# Patient Record
Sex: Male | Born: 1965
Health system: Southern US, Community
[De-identification: ages and names within clinical notes are randomized; demographics above are authoritative.]

## PROBLEM LIST (undated history)

## (undated) DIAGNOSIS — K219 Gastro-esophageal reflux disease without esophagitis: Secondary | ICD-10-CM

## (undated) DIAGNOSIS — E785 Hyperlipidemia, unspecified: Secondary | ICD-10-CM

---

## 2004-12-07 ENCOUNTER — Emergency Department: Payer: Self-pay | Admitting: Emergency Medicine

## 2008-10-19 ENCOUNTER — Emergency Department: Payer: Self-pay | Admitting: Emergency Medicine

## 2010-10-07 ENCOUNTER — Ambulatory Visit: Payer: Self-pay | Admitting: Otolaryngology

## 2010-10-28 ENCOUNTER — Ambulatory Visit: Payer: Self-pay | Admitting: Otolaryngology

## 2011-04-07 ENCOUNTER — Emergency Department: Payer: Self-pay | Admitting: Unknown Physician Specialty

## 2013-08-22 ENCOUNTER — Emergency Department: Payer: Self-pay | Admitting: Emergency Medicine

## 2013-08-22 LAB — URINALYSIS, COMPLETE
Bacteria: NEGATIVE
Bilirubin,UR: NEGATIVE
Glucose,UR: >=500 mg/dL (ref 0–75)
KETONE: NEGATIVE
Leukocyte Esterase: NEGATIVE
Nitrite: NEGATIVE
PROTEIN: NEGATIVE
Ph: 5 (ref 4.5–8.0)
Specific Gravity: 1.021 (ref 1.003–1.030)

## 2013-08-22 LAB — HEPATIC FUNCTION PANEL A (ARMC)
ALT: 52 U/L (ref 12–78)
AST: 29 U/L (ref 15–37)
Albumin: 3.9 g/dL (ref 3.4–5.0)
Alkaline Phosphatase: 69 U/L
Bilirubin, Direct: <0.1 mg/dL (ref 0.00–0.20)
Bilirubin,Total: 0.5 mg/dL (ref 0.2–1.0)
TOTAL PROTEIN: 8 g/dL (ref 6.4–8.2)

## 2013-08-22 LAB — LIPASE, BLOOD: LIPASE: 112 U/L (ref 73–393)

## 2013-08-22 LAB — CBC WITH DIFFERENTIAL/PLATELET
BASOS ABS: 0 10*3/uL (ref 0.0–0.1)
Basophil %: 0.2 %
EOS ABS: 0 10*3/uL (ref 0.0–0.7)
Eosinophil %: 0.1 %
HCT: 43 % (ref 40.0–52.0)
HGB: 15 g/dL (ref 13.0–18.0)
LYMPHS PCT: 7.5 %
Lymphocyte #: 0.8 10*3/uL — ABNORMAL LOW (ref 1.0–3.6)
MCH: 31.5 pg (ref 26.0–34.0)
MCHC: 34.9 g/dL (ref 32.0–36.0)
MCV: 91 fL (ref 80–100)
MONO ABS: 0.3 x10 3/mm (ref 0.2–1.0)
MONOS PCT: 2.6 %
Neutrophil #: 10 10*3/uL — ABNORMAL HIGH (ref 1.4–6.5)
Neutrophil %: 89.6 %
Platelet: 394 10*3/uL (ref 150–440)
RBC: 4.76 10*6/uL (ref 4.40–5.90)
RDW: 13 % (ref 11.5–14.5)
WBC: 11.1 10*3/uL — ABNORMAL HIGH (ref 3.8–10.6)

## 2013-08-22 LAB — BASIC METABOLIC PANEL
Anion Gap: 10 (ref 7–16)
BUN: 18 mg/dL (ref 7–18)
CREATININE: 0.95 mg/dL (ref 0.60–1.30)
Calcium, Total: 8.8 mg/dL (ref 8.5–10.1)
Chloride: 103 mmol/L (ref 98–107)
Co2: 24 mmol/L (ref 21–32)
EGFR (African American): >60
EGFR (Non-African Amer.): >60
Glucose: 124 mg/dL — ABNORMAL HIGH (ref 65–99)
OSMOLALITY: 277 (ref 275–301)
Potassium: 3.6 mmol/L (ref 3.5–5.1)
Sodium: 137 mmol/L (ref 136–145)

## 2013-08-22 LAB — TROPONIN I

## 2014-01-12 ENCOUNTER — Emergency Department: Payer: Self-pay | Admitting: Emergency Medicine

## 2015-09-24 IMAGING — CR RIGHT INDEX FINGER 2+V
1 series · 3 of 3 positions shown · non-contrast
Comparison: None.

CLINICAL DATA: Table saw injury, initial encounter

EXAM:
RIGHT INDEX FINGER 2+V

[Series 1: pa · 0.17mm/px · 3 of 3 slices shown]
[im 1/3]
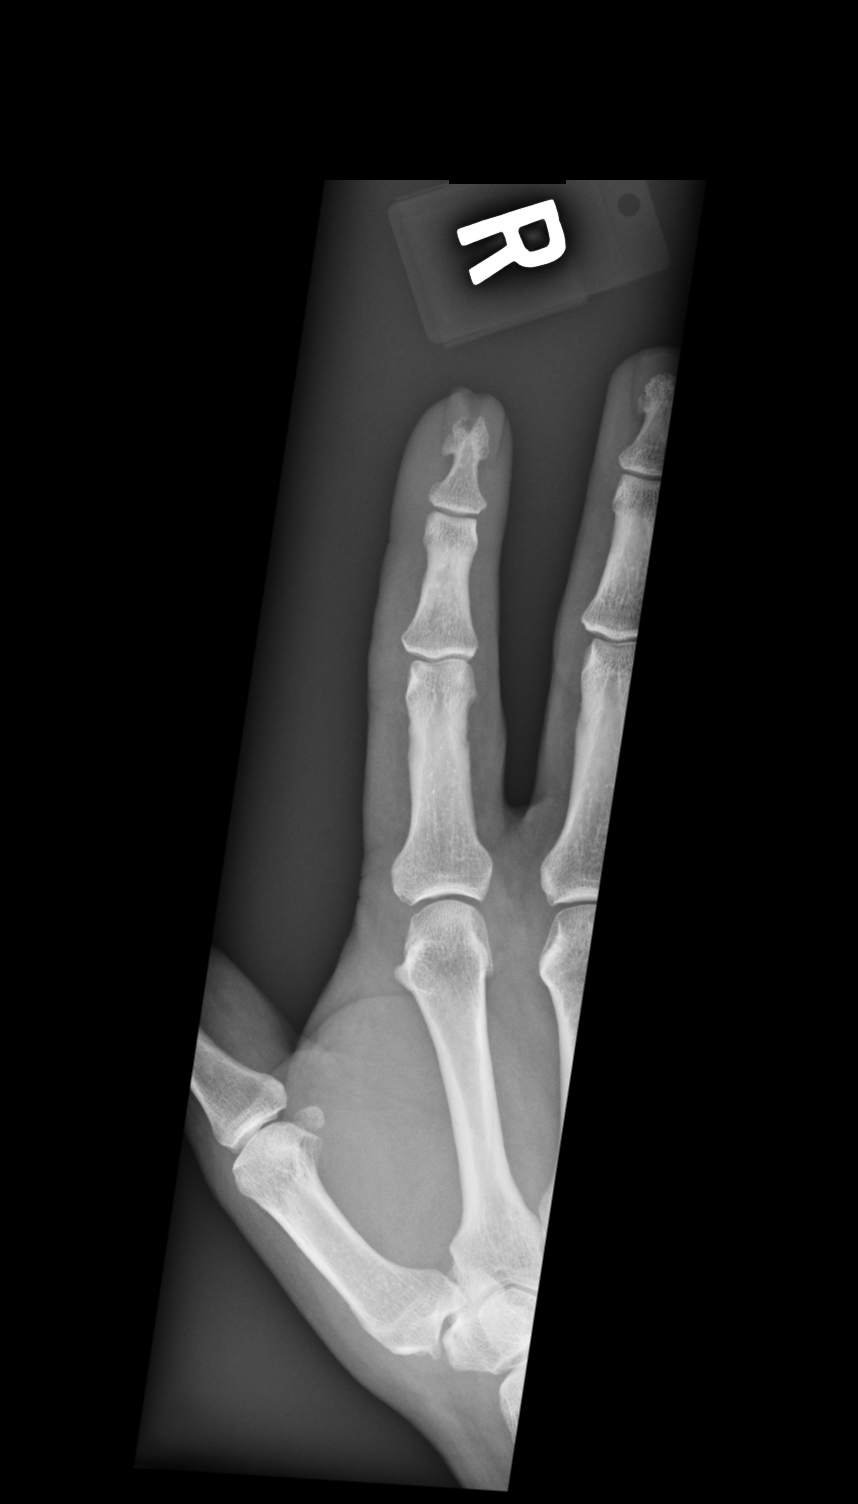
[im 2/3]
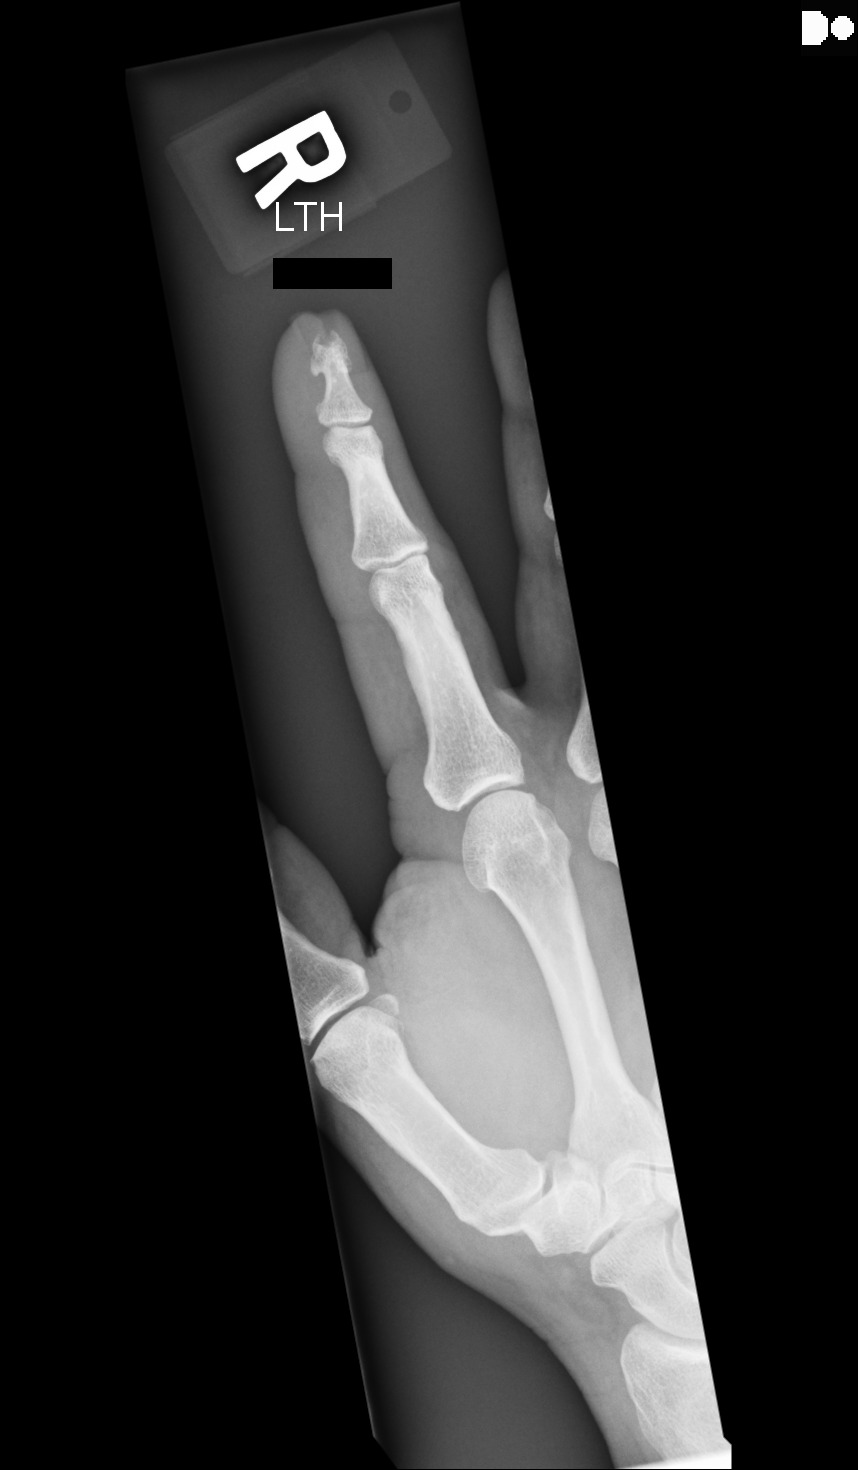
[im 3/3]
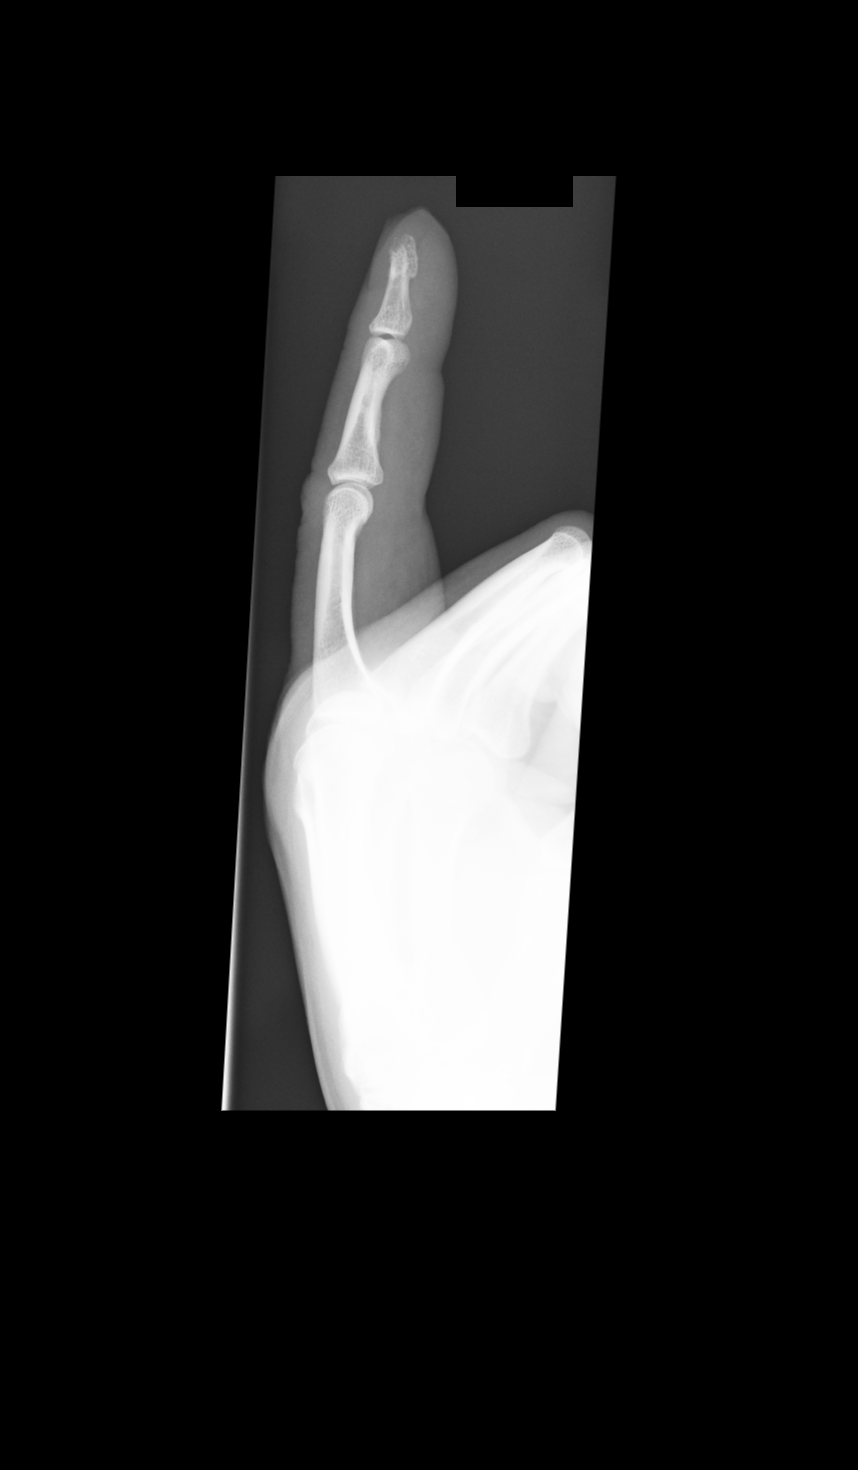

[3 of 3 positions shown; findings below may reference images not displayed]

FINDINGS: Soft tissue defect is noted in the distal aspect of the second digit
with a bony defect in the phalangeal tuft of the distal phalanx
consistent with given clinical history. No other fracture is seen.
No radiopaque foreign body is noted.
IMPRESSION: Soft tissue defect with associated bony defect consistent with the
recent injury.

## 2016-03-05 DIAGNOSIS — R5381 Other malaise: Secondary | ICD-10-CM | POA: Diagnosis not present

## 2016-03-05 DIAGNOSIS — R5383 Other fatigue: Secondary | ICD-10-CM | POA: Diagnosis not present

## 2016-03-05 DIAGNOSIS — R509 Fever, unspecified: Secondary | ICD-10-CM | POA: Diagnosis not present

## 2017-08-02 ENCOUNTER — Emergency Department: Payer: Worker's Compensation

## 2017-08-02 ENCOUNTER — Encounter: Payer: Self-pay | Admitting: Emergency Medicine

## 2017-08-02 ENCOUNTER — Emergency Department
Admission: EM | Admit: 2017-08-02 | Discharge: 2017-08-02 | Disposition: A | Discharge status: Home / Self Care | Payer: Worker's Compensation | Attending: Emergency Medicine | Admitting: Emergency Medicine

## 2017-08-02 DIAGNOSIS — Y929 Unspecified place or not applicable: Secondary | ICD-10-CM | POA: Insufficient documentation

## 2017-08-02 DIAGNOSIS — W010XXA Fall on same level from slipping, tripping and stumbling without subsequent striking against object, initial encounter: Secondary | ICD-10-CM | POA: Insufficient documentation

## 2017-08-02 DIAGNOSIS — Y9389 Activity, other specified: Secondary | ICD-10-CM | POA: Diagnosis not present

## 2017-08-02 DIAGNOSIS — Y990 Civilian activity done for income or pay: Secondary | ICD-10-CM | POA: Insufficient documentation

## 2017-08-02 DIAGNOSIS — F17200 Nicotine dependence, unspecified, uncomplicated: Secondary | ICD-10-CM | POA: Insufficient documentation

## 2017-08-02 DIAGNOSIS — S99912A Unspecified injury of left ankle, initial encounter: Secondary | ICD-10-CM | POA: Diagnosis present

## 2017-08-02 MED ORDER — IBUPROFEN 800 MG PO TABS: 800.0000 mg | ORAL_TABLET | Freq: Three times a day (TID) | ORAL | 0 refills | Status: DC | PRN

## 2017-08-02 NOTE — ED Provider Notes (Signed)
[facility] Emergency Department Provider Note  ____________________________________________  Time seen: Approximately 4:09 PM  I have reviewed the triage vital signs and the nursing notes.   HISTORY  Chief Complaint Ankle Injury    HPI Todd Castaneda is a 52 y.o. male that presents to the emergency department for evaluation of left ankle pain after injury today.  Patient stepped on some wood and rolled his ankle inward.  He fell to the ground.  He did not hit his head or lose consciousness.  He is having pain with ankle range of motion.  No numbness, tingling.   History reviewed. No pertinent past medical history.  There are no active problems to display for this patient.   History reviewed. No pertinent surgical history.  Prior to Admission medications   Medication Sig Start Date End Date Taking? Authorizing Provider  ibuprofen (ADVIL,MOTRIN) 800 MG tablet Take 1 tablet (800 mg total) by mouth every 8 (eight) hours as needed. 08/02/17   Laban Emperor, PA-C    Allergies Patient has no known allergies.  No family history on file.  Social History Social History   Tobacco Use  . Smoking status: Current Every Day Smoker  . Smokeless tobacco: Never Used  Substance Use Topics  . Alcohol use: Not on file  . Drug use: Not on file     Review of Systems  Cardiovascular: No chest pain. Respiratory:  No SOB. Gastrointestinal:  No nausea, no vomiting.  Musculoskeletal: Positive for ankle pain. Skin: Negative for rash, abrasions, lacerations, ecchymosis. Neurological: Negative for numbness or tingling   ____________________________________________   PHYSICAL EXAM:  VITAL SIGNS: ED Triage Vitals  Enc Vitals Group     BP 08/02/17 1520 134/75     Pulse Rate 08/02/17 1520 72     Resp 08/02/17 1520 19     Temp 08/02/17 1520 97.8 F (36.6 C)     Temp Source 08/02/17 1520 Oral     SpO2 08/02/17 1520 95 %     Weight 08/02/17 1521 215 lb (97.5  kg)     Height 08/02/17 1521 5\' 9"  (1.753 m)     Head Circumference --      Peak Flow --      Pain Score 08/02/17 1512 10     Pain Loc --      Pain Edu? --      Excl. in [place]? --      Constitutional: Alert and oriented. Well appearing and in no acute distress. Eyes: Conjunctivae are normal. PERRL. EOMI. Head: Atraumatic. ENT:      Ears:      Nose: No congestion/rhinnorhea.      Mouth/Throat: Mucous membranes are moist.  Neck: No stridor.  Cardiovascular: Normal rate, regular rhythm.  Good peripheral circulation.  Symmetric dorsalis pedis pulses bilaterally. Respiratory: Normal respiratory effort without tachypnea or retractions. Lungs CTAB. Good air entry to the bases with no decreased or absent breath sounds. Musculoskeletal: Full range of motion to all extremities. No gross deformities appreciated.  Tenderness to palpation over lateral malleolus.  No visible swelling or ecchymosis. Neurologic:  Normal speech and language. No gross focal neurologic deficits are appreciated.  Skin:  Skin is warm, dry and intact. No rash noted. Psychiatric: Mood and affect are normal. Speech and behavior are normal. Patient exhibits appropriate insight and judgement.   ____________________________________________   LABS (all labs ordered are listed, but only abnormal results are displayed)  Labs Reviewed - No data to display ____________________________________________  EKG   ____________________________________________  RADIOLOGY Robinette Haines, personally viewed and evaluated these images (plain radiographs) as part of my medical decision making, as well as reviewing the written report by the radiologist.  Dg Ankle Complete Left  Result Date: 08/02/2017 CLINICAL DATA:  Slipped at work with subsequent pain. EXAM: LEFT ANKLE COMPLETE - 3+ VIEW COMPARISON:  None. FINDINGS: There is no evidence of fracture, dislocation, or joint effusion. There is no evidence of arthropathy or other focal  bone abnormality. Soft tissues are unremarkable. IMPRESSION: Negative. Electronically Signed   By: Nelson Chimes M.D.   On: 08/02/2017 15:35    ____________________________________________    PROCEDURES  Procedure(s) performed:    Procedures    Medications - No data to display   ____________________________________________   INITIAL IMPRESSION / ASSESSMENT AND PLAN / ED COURSE  Pertinent labs & imaging results that were available during my care of the patient were reviewed by me and considered in my medical decision making (see chart for details).  Review of the [place] CSRS was performed in accordance of the [place] prior to dispensing any controlled drugs.   Patient's diagnosis is consistent with ankle sprain.  Vital signs and exam are reassuring.  X-ray negative for acute bony abnormalities.  Ankle was ace wrapped and crutches were given.  Patient will be discharged home with prescriptions for ibuprofen. Patient is to follow up with PCP as directed. Patient is given ED precautions to return to the ED for any worsening or new symptoms.     ____________________________________________  FINAL CLINICAL IMPRESSION(S) / ED DIAGNOSES  Final diagnoses:  Injury of left ankle, initial encounter      NEW MEDICATIONS STARTED DURING THIS VISIT:  ED Discharge Orders        Ordered    ibuprofen (ADVIL,MOTRIN) 800 MG tablet  Every 8 hours PRN     08/02/17 1631          This chart was dictated using voice recognition software/Dragon. Despite best efforts to proofread, errors can occur which can change the meaning. Any change was purely unintentional.    Laban Emperor, PA-C 08/03/17 0003    Clearnce Hasten Randall An, MD 08/04/17 [phone]

## 2017-08-02 NOTE — ED Notes (Signed)
Spoke with [place] to verify what they needed for worker's comp. They would like a BAT and UDS.

## 2017-08-02 NOTE — ED Triage Notes (Signed)
Pt comes itno the ED via POV c/o left ankle pain after slipping at work on wood.  Patient denies any "cracks" but states that something "popped".  Patient has no obvious deformity or bruising noted at this time.  Patient in NAD with even and unlabored respirations.

## 2018-04-02 ENCOUNTER — Other Ambulatory Visit: Payer: Self-pay

## 2018-04-02 ENCOUNTER — Emergency Department
Admission: EM | Admit: 2018-04-02 | Discharge: 2018-04-02 | Disposition: A | Discharge status: Home / Self Care | Payer: 59 | Attending: Emergency Medicine | Admitting: Emergency Medicine

## 2018-04-02 ENCOUNTER — Encounter: Payer: Self-pay | Admitting: Emergency Medicine

## 2018-04-02 DIAGNOSIS — R21 Rash and other nonspecific skin eruption: Secondary | ICD-10-CM | POA: Diagnosis present

## 2018-04-02 DIAGNOSIS — T7840XA Allergy, unspecified, initial encounter: Secondary | ICD-10-CM | POA: Insufficient documentation

## 2018-04-02 DIAGNOSIS — F17210 Nicotine dependence, cigarettes, uncomplicated: Secondary | ICD-10-CM | POA: Diagnosis not present

## 2018-04-02 MED ORDER — PREDNISONE 10 MG (21) PO TBPK: ORAL_TABLET | ORAL | 0 refills | Status: DC

## 2018-04-02 MED ORDER — FAMOTIDINE 20 MG PO TABS: 20.0000 mg | ORAL_TABLET | Freq: Two times a day (BID) | ORAL | 0 refills | Status: DC

## 2018-04-02 NOTE — ED Notes (Signed)
Pt stated taking benadryl las night  "but it didn't do anything".

## 2018-04-02 NOTE — ED Notes (Signed)
Upon assessment pt presents with a noticeable rash on trunk, back, neck and arms. Pt states taking "antibiotics for my toothache" since last week. Per pt, last "pill" taken last Sunday night;pt noticed some "itching all over my body today and my eyes were puffy this morning" Pt denies SOB, NAD noted at this time. Pt is A/Ox4.

## 2018-04-02 NOTE — ED Provider Notes (Signed)
[facility] Emergency Department Provider Note  ____________________________________________   First MD Initiated Contact with Patient 04/02/18 [phone]     (approximate)  I have reviewed the triage vital signs and the nursing notes.   HISTORY  Chief Complaint Allergic Reaction    HPI Todd Castaneda is a 53 y.o. male presents emergency department complaining of a rash.  He has been taking Augmentin for over 7 days for dental abscess.  He states he is never had problems with penicillin family before.  He denies any chest pain or shortness of breath.  Denies any throat swelling.  He took Benadryl which did help the itchiness of the rash.    History reviewed. No pertinent past medical history.  There are no active problems to display for this patient.   History reviewed. No pertinent surgical history.  Prior to Admission medications   Medication Sig Start Date End Date Taking? Authorizing Provider  famotidine (PEPCID) 20 MG tablet Take 1 tablet (20 mg total) by mouth 2 (two) times daily. 04/02/18   , Linden Dolin, PA-C  predniSONE (STERAPRED UNI-PAK 21 TAB) 10 MG (21) TBPK tablet Take 6 pills on day one then decrease by 1 pill each day 04/02/18   Versie Starks, PA-C    Allergies Augmentin [amoxicillin-pot clavulanate]  No family history on file.  Social History Social History   Tobacco Use  . Smoking status: Current Every Day Smoker  . Smokeless tobacco: Never Used  Substance Use Topics  . Alcohol use: Not on file  . Drug use: Not on file    Review of Systems  Constitutional: No fever/chills Eyes: No visual changes. ENT: No sore throat. Respiratory: Denies cough Genitourinary: Negative for dysuria. Musculoskeletal: Negative for back pain. Skin: Positive for hives    ____________________________________________   PHYSICAL EXAM:  VITAL SIGNS: ED Triage Vitals  Enc Vitals Group     BP 04/02/18 0623 (!) 148/82     Pulse Rate  04/02/18 0623 82     Resp 04/02/18 0623 20     Temp 04/02/18 0623 98.1 F (36.7 C)     Temp Source 04/02/18 0623 Oral     SpO2 04/02/18 0623 99 %     Weight 04/02/18 0621 220 lb (99.8 kg)     Height 04/02/18 0621 5\' 7"  (1.702 m)     Head Circumference --      Peak Flow --      Pain Score 04/02/18 0620 0     Pain Loc --      Pain Edu? --      Excl. in [place]? --     Constitutional: Alert and oriented. Well appearing and in no acute distress. Eyes: Conjunctivae are normal.  Head: Atraumatic. Nose: No congestion/rhinnorhea. Mouth/Throat: Mucous membranes are moist.  No throat swelling Neck:  supple no lymphadenopathy noted Cardiovascular: Normal rate, regular rhythm. Heart sounds are normal Respiratory: Normal respiratory effort.  No retractions, lungs c t a  GU: deferred Musculoskeletal: FROM all extremities, warm and well perfused Neurologic:  Normal speech and language.  Skin:  Skin is warm, dry and intact.  Hives scattered all over the trunk Psychiatric: Mood and affect are normal. Speech and behavior are normal.  ____________________________________________   LABS (all labs ordered are listed, but only abnormal results are displayed)  Labs Reviewed - No data to display ____________________________________________   ____________________________________________  RADIOLOGY    ____________________________________________   PROCEDURES  Procedure(s) performed: No  Procedures  ____________________________________________   INITIAL IMPRESSION / ASSESSMENT AND PLAN / ED COURSE  Pertinent labs & imaging results that were available during my care of the patient were reviewed by me and considered in my medical decision making (see chart for details).   Patient is 53 year old male presents emergency department complaining of a rash secondary to Augmentin.  Physical exam shows a well adult.  No wheezing or throat swelling.  Hives noted throughout the  trunk.  Explained the findings to the patient.  He is to discontinue the Augmentin.  He is to notify his pharmacist along with any other providers that he is now allergic to Augmentin.  He was given a prescription for prednisone and Pepcid.  He is to continue the Benadryl.  If he is worsening he is to return to the emergency department.  He states he understands will comply.  He was discharged in stable condition.     As part of my medical decision making, I reviewed the following data within the [place] notes reviewed and incorporated, Old chart reviewed, Notes from prior ED visits and  Controlled Substance Database  ____________________________________________   FINAL CLINICAL IMPRESSION(S) / ED DIAGNOSES  Final diagnoses:  Allergic reaction, initial encounter      NEW MEDICATIONS STARTED DURING THIS VISIT:  New Prescriptions   FAMOTIDINE (PEPCID) 20 MG TABLET    Take 1 tablet (20 mg total) by mouth 2 (two) times daily.   PREDNISONE (STERAPRED UNI-PAK 21 TAB) 10 MG (21) TBPK TABLET    Take 6 pills on day one then decrease by 1 pill each day     Note:  This document was prepared using Dragon voice recognition software and may include unintentional dictation errors.    Versie Starks, PA-C 04/02/18 3491    Eula Listen, MD 04/02/18 1200

## 2018-04-02 NOTE — ED Triage Notes (Addendum)
Patient ambulatory to triage with steady gait, without difficulty or distress noted; pt reports since yesterday itchy rash to trunk; currently taking Augmentin

## 2018-04-02 NOTE — Discharge Instructions (Addendum)
You are allergic to Augmentin, tell any prescriber that you are allergic to this medicine.  Notify your pharmacy also.  Take the medication as prescribed.  Return to the ER if worsening.  Stop taking the augmentin

## 2019-04-14 IMAGING — DX DG ANKLE COMPLETE 3+V*L*
3 series · 3 of 3 positions shown · non-contrast
Comparison: None.

CLINICAL DATA: Slipped at work with subsequent pain.

EXAM:
LEFT ANKLE COMPLETE - 3+ VIEW

[ankle ap]
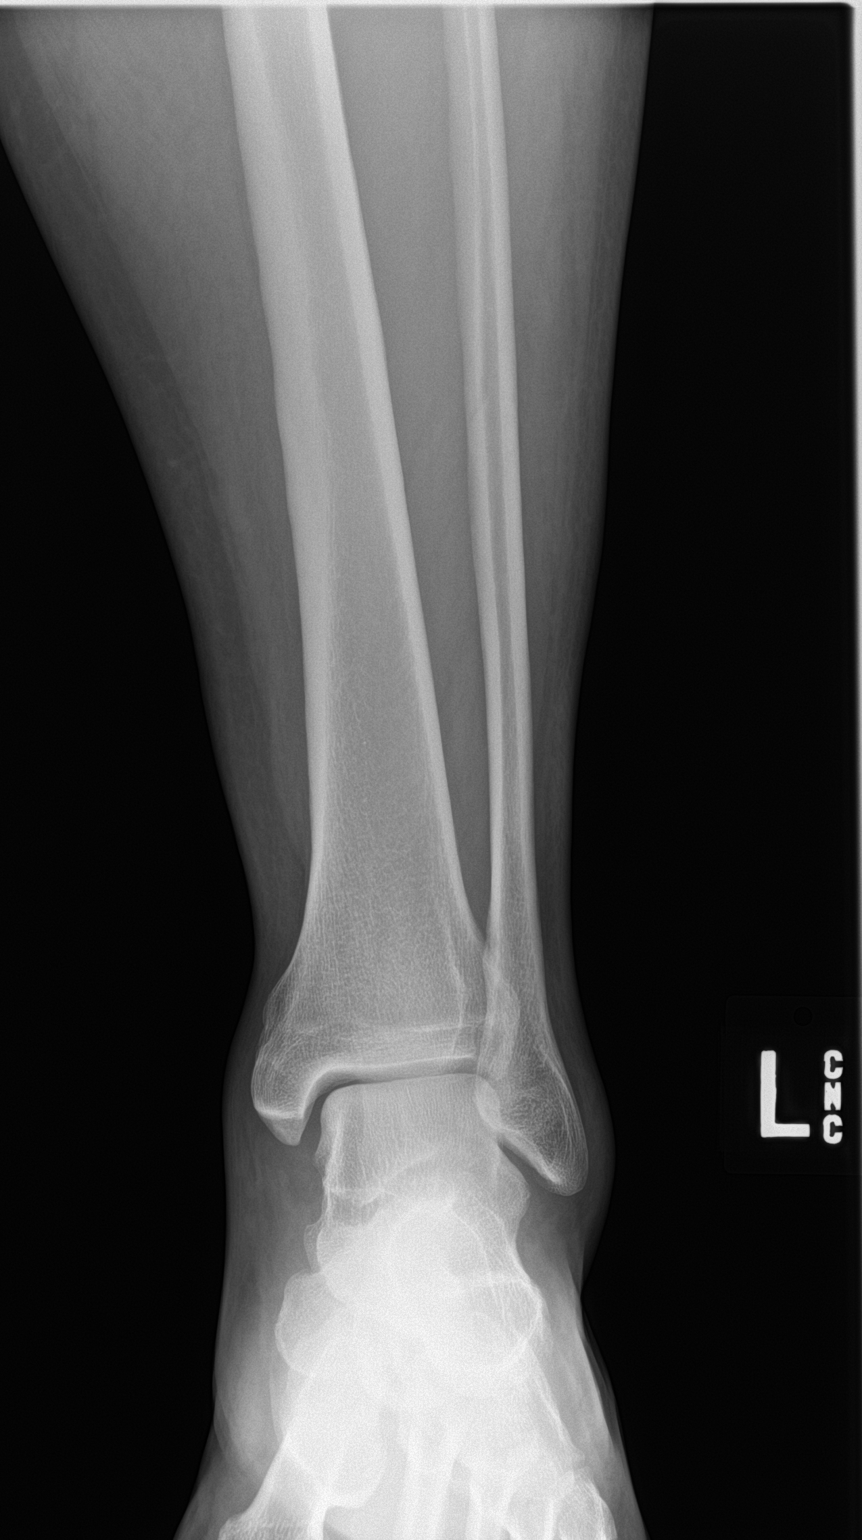

[ankle obl]
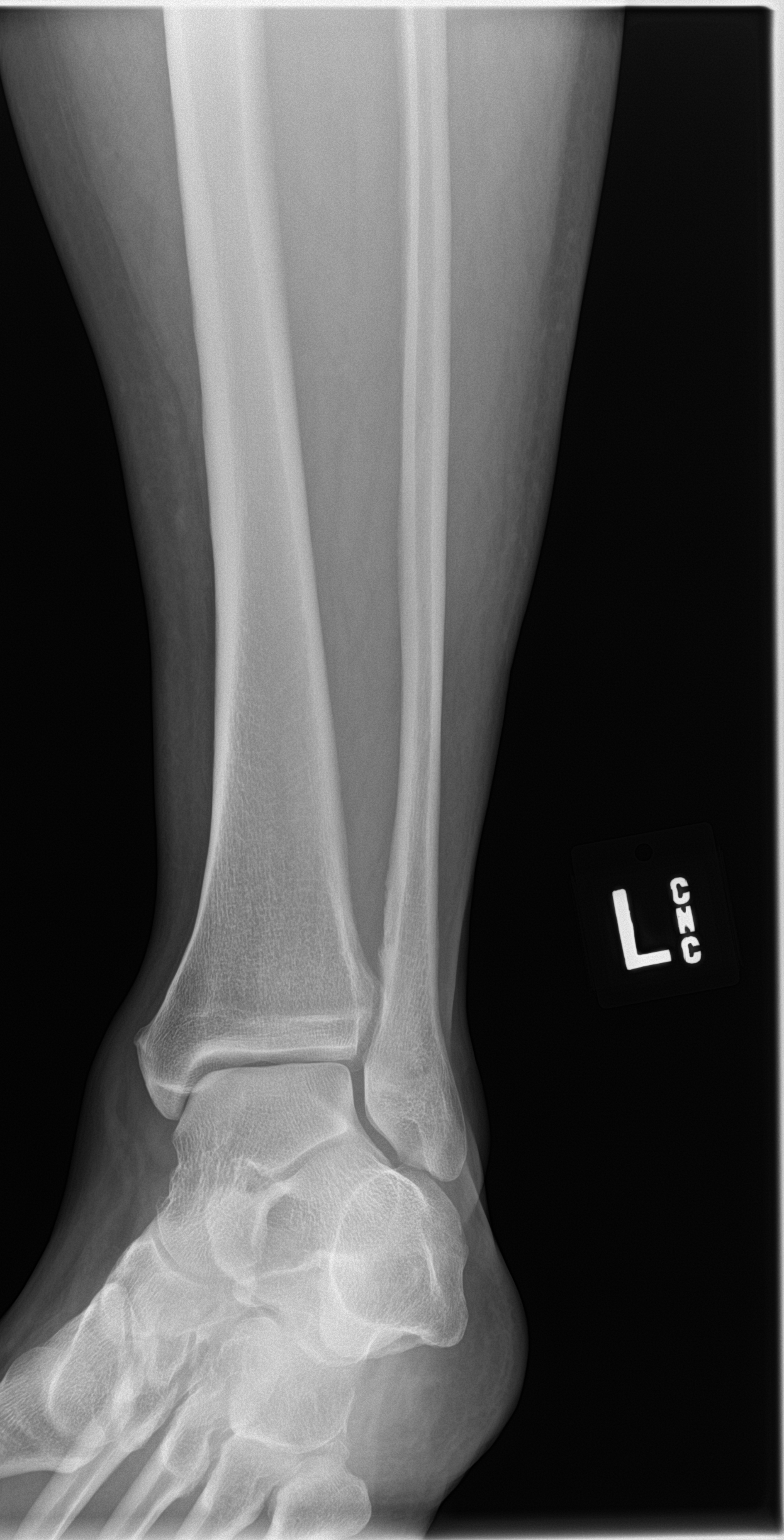

[ankle lat]
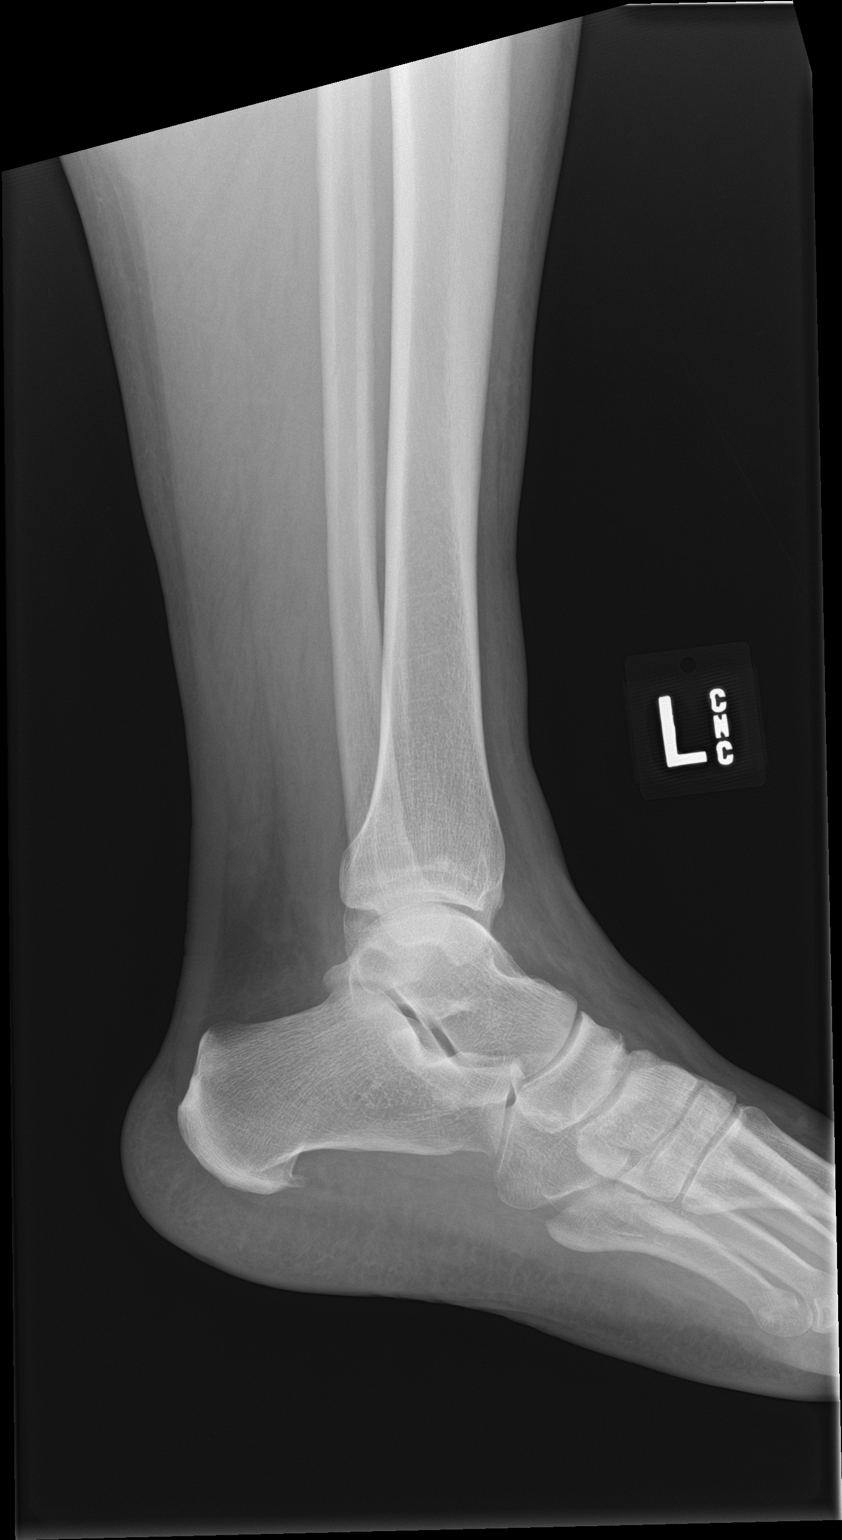

[3 of 3 positions shown; findings below may reference images not displayed]

FINDINGS: There is no evidence of fracture, dislocation, or joint effusion.
There is no evidence of arthropathy or other focal bone abnormality.
Soft tissues are unremarkable.
IMPRESSION: Negative.

## 2019-06-17 NOTE — Progress Notes (Signed)
Patient ID: Todd Castaneda, male    DOB: August 27, 1965, 54 y.o.   MRN: [id number]  PCP: Towanda Malkin, MD  No chief complaint on file.   Subjective:   Todd Castaneda is a 54 y.o. male, presents to clinic with CC of the following:  No chief complaint on file.   HPI:  Patient is a 54 year old male who presents today new to the practice. He has had no recent regular follow-up with a primary care physician. He notes that in general he has been pretty healthy.  On chart review, his recent medical care included an office visit at the end of January 2020 for a potential dental abscess, with an emergency room visit in early February for a rash, with concerns for an allergy to the Augmentin product that he was prescribed for the dental abscess.  He noted he does not see a dentist regularly, noting cost issue concerns.  He was also seen in the emergency department in June 2019 for an ankle injury.  Obesity Weight has been fairly stable over the past year Wants to lose weight moving forward, and encouraged. Discussed healthy diet importance, and also portion control.  Also some form of regular physical activity important, and he notes he wants to start to do some exercise outside of work and encouraged.  Walking was felt to be a good way to increase his activity, and he noted he was to increase walking presently. Wt Readings from Last 3 Encounters:  06/18/19 212 lb 9.6 oz (96.4 kg)  04/02/18 220 lb (99.8 kg)  08/02/17 215 lb (97.5 kg)   BP elevation No h/o HTN in his past Has checked at [facility] periodically, last about a month ago and not elevated, about 120/80 No CP, palp, SOB, LE swelling, no increased headaches, vision changes (notes he does often use reading glasses to help now) BP Readings from Last 3 Encounters:  06/18/19 (!) 150/90  04/02/18 (!) 148/82  08/02/17 134/75   GERD Taking OTC Pepcid, takes one in am and helps all day. Bad heartburn often if not take  one. Tries to avoid spicy foods to help. Been last few years at least that he has had the symptoms intermittently, and using the Pepcid product which is helpful.   No dark or black stools, no BRBPR, no chronic abd pains, no N/V  ED Notes he is able to get erections, often do not last.   Has used the Viagra type product in the past, is helpful, and does not have any further prescriptions for this medicine.    Prostate cancer screening Patient has followed PSAs in the past for this, discussed role, and will continue to follow. Does not get up overnight to urinate, denies marked hesitancy, frequency, urgency symptoms. Notes she still has good urine flow. No family history of prostate cancer  Colon cancer screening Has not had a colonoscopy or any other form of screening in his past.  Discussed options with him including a colonoscopy, versus a Cologuard product.  He was not sure today what he wanted to pursue, wanted time to think about it, and can share on follow-up how he wants to proceed. No family history of colon cancer Denies any recent black stools, dark stools, bleeding per rectum, no chronic abdominal pains, no chronic constipation or diarrhea.  Imm -tetanus booster needed, and Tdap given today.  Diet- wants to try lessen fried foods, not too restrictive presently, discussed a healthier diet and the  importance of such, with a Mediterranean type diet given as an example, with information provided in the AVS as well. Exercise-a lot at work, does [employer], none outside of work, wants to start walking Tobacco-former smoker Alcohol-denied use He noted he lives with his son, is monogamous with his current partner,   Current Outpatient Medications:  .  famotidine (PEPCID) 20 MG tablet, Take 1 tablet (20 mg total) by mouth 2 (two) times daily., Disp: 14 tablet, Rfl: 0   Allergies  Allergen Reactions  . Augmentin [Amoxicillin-Pot Clavulanate] Hives     No past surgical  history on file.   Family History  Problem Relation Age of Onset  . Diabetes Mother      Social History   Tobacco Use  . Smoking status: Former Research scientist (life sciences)  . Smokeless tobacco: Never Used  Substance Use Topics  . Alcohol use: Never    With staff assistance, above reviewed with the patient today.  ROS: As per HPI, denied any ST D concerns, and offered STD testing as part of the lab evaluation, although not felt needed with no concerning symptoms and no high risk activities of concern recently.  Denied any numbness or tingling in the extremities, no balance difficulties otherwise no specific complaints on a limited and focused system review   No results found for this or any previous visit (from the past 72 hour(s)).   PHQ2/9: Depression screen [facility] 2/9 06/18/2019  Decreased Interest 0  Down, Depressed, Hopeless 0  PHQ - 2 Score 0  Altered sleeping 0  Tired, decreased energy 1  Change in appetite 0  Feeling bad or failure about yourself  0  Trouble concentrating 0  Moving slowly or fidgety/restless 0  Suicidal thoughts 0  PHQ-9 Score 1  Difficult doing work/chores Not difficult at all   PHQ-2/9 Result is neg  Fall Risk: Fall Risk  06/18/2019  Falls in the past year? 0  Number falls in past yr: 0  Injury with Fall? 0      Objective:   Vitals:   06/18/19 0905  BP: (!) 150/90  Pulse: 87  Resp: 16  Temp: (!) 97.3 F (36.3 C)  TempSrc: Temporal  Weight: 212 lb 9.6 oz (96.4 kg)  Height: 5\' 7"  (1.702 m)    Body mass index is 33.3 kg/m. Recheck of his blood pressure by me during the exam was 154/92 on the left  Physical Exam   NAD, masked, pleasant HEENT - [place]/AT, sclera anicteric, PERRL, EOMI, conj - non-inj'ed, TM's and canals clear, pharynx clear, poor dentition Neck - supple, no adenopathy, no TM, carotids 2+ and = without bruits bilat Car - RRR without m/g/r Pulm- RR and effort normal at rest, CTA without wheeze or rales Abd - soft, NT, mildly obese, ND,  BS+,  no masses, no obvious HSM Back - no CVA tenderness GU - Prostate - no tenderness, fluctuance, nodules on palpation, not markedly increased in size Testes without swelling, nodules, tenderness, no inguinal adenopathy noted, No rash present, no penile discharge No external hemmorhoids Skin- no rash noted on exposed areas Ext - no LE edema, no active joints Neuro/psychiatric - affect was not flat, appropriate with conversation  Alert and oriented  Grossly non-focal - good strength on testing extremities, sensation intact to LT in distal extremities  Speech and gait are normal   No results found for this or any previous visit.     Assessment & Plan:    1. Encounter to establish care with  new doctor   2. Encounter for hepatitis C screening test for low risk patient Agreed to have the screening today - Hepatitis C antibody  3. Screening for HIV (human immunodeficiency virus) Agreed to have this screen today, offered other STD screening test, although not felt needed by the patient after our discussion as noted above - HIV Antibody (routine testing w rflx)  4. Screening for lipid disorders Agreed to check his cholesterol status presently. - Lipid panel  5. Screening for colorectal cancer Discussed options, and he wanted to have time to think about it.  Will address again on follow-up.  Has not had colorectal cancer screening to date.  6. Screening for prostate cancer Discussed the role of PSAs and swallowing, and he was in agreement with checking these over time.    7. Elevated BP without diagnosis of hypertension Educated, discussed how often not add a blood pressure medicine on patient's first visit, especially when in the borderline to slightly high readings as noted here.  Discussed goals for blood pressure management, and he will have it checked a couple times on the outside prior to a follow-up in 3 to 4 weeks time to reassess.  Discussed if it remains above [id number] systolic  and above 90 diastolic, will recommend starting a medicine, and await that follow-up. Also will check some labs today. Also, his goals of trying to lose some weight, and getting a little more active with physical activity were encouraged, and can be helpful with blood pressure control. - COMPLETE METABOLIC PANEL WITH GFR - CBC with Differential/Platelet  8. Erectile dysfunction, unspecified erectile dysfunction type Has had success in the past with sildenafil products, and do feel reasonable to continue. Reviewed the medication, and we will start with 120 mg tablet, although can increase to 2 if not helpful, and then to 3 if to not helpful over time, and await his response. We will hold on checking a testosterone level presently. - sildenafil (REVATIO) 20 MG tablet; Take 1-3 tabs 0.5-2 hours prior to planned sexual activity  Dispense: 30 tablet; Refill: 5 - Urinalysis, Complete  9. Screening for thyroid disorder We will check a TSH. - TSH  10. Gastroesophageal reflux disease without esophagitis Doing well on the over-the-counter dose of Pepcid presently, and will continue. Discussed reflux precautions today and the importance of that and management as well. - COMPLETE METABOLIC PANEL WITH GFR - CBC with Differential/Platelet   We will follow-up in approximately 3 to 4 weeks time, hopefully having a couple blood pressure readings from the outside to help assess.  Await lab test presently as well. Follow-up sooner as needed.     Towanda Malkin, MD 06/18/19 9:15 AM

## 2019-06-18 ENCOUNTER — Ambulatory Visit: Payer: 59 | Admitting: Internal Medicine

## 2019-06-18 ENCOUNTER — Other Ambulatory Visit: Payer: Self-pay

## 2019-06-18 ENCOUNTER — Encounter: Payer: Self-pay | Admitting: Internal Medicine

## 2019-06-18 VITALS — BP 150/90 | HR 87 | Temp 97.3°F | Resp 16 | Ht 67.0 in | Wt 212.6 lb

## 2019-06-18 DIAGNOSIS — Z1329 Encounter for screening for other suspected endocrine disorder: Secondary | ICD-10-CM

## 2019-06-18 DIAGNOSIS — Z114 Encounter for screening for human immunodeficiency virus [HIV]: Secondary | ICD-10-CM

## 2019-06-18 DIAGNOSIS — Z13220 Encounter for screening for lipoid disorders: Secondary | ICD-10-CM

## 2019-06-18 DIAGNOSIS — Z7689 Persons encountering health services in other specified circumstances: Secondary | ICD-10-CM | POA: Diagnosis not present

## 2019-06-18 DIAGNOSIS — K219 Gastro-esophageal reflux disease without esophagitis: Secondary | ICD-10-CM

## 2019-06-18 DIAGNOSIS — Z23 Encounter for immunization: Secondary | ICD-10-CM

## 2019-06-18 DIAGNOSIS — Z1159 Encounter for screening for other viral diseases: Secondary | ICD-10-CM | POA: Diagnosis not present

## 2019-06-18 DIAGNOSIS — Z125 Encounter for screening for malignant neoplasm of prostate: Secondary | ICD-10-CM

## 2019-06-18 DIAGNOSIS — Z1212 Encounter for screening for malignant neoplasm of rectum: Secondary | ICD-10-CM

## 2019-06-18 DIAGNOSIS — N529 Male erectile dysfunction, unspecified: Secondary | ICD-10-CM

## 2019-06-18 DIAGNOSIS — R030 Elevated blood-pressure reading, without diagnosis of hypertension: Secondary | ICD-10-CM

## 2019-06-18 DIAGNOSIS — Z1211 Encounter for screening for malignant neoplasm of colon: Secondary | ICD-10-CM

## 2019-06-18 MED ORDER — SILDENAFIL CITRATE 20 MG PO TABS: ORAL_TABLET | ORAL | 5 refills | Status: DC

## 2019-06-18 NOTE — Patient Instructions (Signed)
Gastroesophageal Reflux Disease, Adult Gastroesophageal reflux (GER) happens when acid from the stomach flows up into the tube that connects the mouth and the stomach (esophagus). Normally, food travels down the esophagus and stays in the stomach to be digested. With GER, food and stomach acid sometimes move back up into the esophagus. You may have a disease called gastroesophageal reflux disease (GERD) if the reflux: Happens often. Causes frequent or very bad symptoms. Causes problems such as damage to the esophagus. When this happens, the esophagus becomes sore and swollen (inflamed). Over time, GERD can make small holes (ulcers) in the lining of the esophagus. What are the causes? This condition is caused by a problem with the muscle between the esophagus and the stomach. When this muscle is weak or not normal, it does not close properly to keep food and acid from coming back up from the stomach. The muscle can be weak because of: Tobacco use. Pregnancy. Having a certain type of hernia (hiatal hernia). Alcohol use. Certain foods and drinks, such as coffee, chocolate, onions, and peppermint. What increases the risk? You are more likely to develop this condition if you: Are overweight. Have a disease that affects your connective tissue. Use NSAID medicines. What are the signs or symptoms? Symptoms of this condition include: Heartburn. Difficult or painful swallowing. The feeling of having a lump in the throat. A bitter taste in the mouth. Bad breath. Having a lot of saliva. Having an upset or bloated stomach. Belching. Chest pain. Different conditions can cause chest pain. Make sure you see your doctor if you have chest pain. Shortness of breath or noisy breathing (wheezing). Ongoing (chronic) cough or a cough at night. Wearing away of the surface of teeth (tooth enamel). Weight loss. How is this treated? Treatment will depend on how bad your symptoms are. Your doctor may  suggest: Changes to your diet. Medicine. Surgery. Follow these instructions at home: Eating and drinking  Follow a diet as told by your doctor. You may need to avoid foods and drinks such as: Coffee and tea (with or without caffeine). Drinks that contain alcohol. Energy drinks and sports drinks. Bubbly (carbonated) drinks or sodas. Chocolate and cocoa. Peppermint and mint flavorings. Garlic and onions. Horseradish. Spicy and acidic foods. These include peppers, chili powder, curry powder, vinegar, hot sauces, and BBQ sauce. Citrus fruit juices and citrus fruits, such as oranges, lemons, and limes. Tomato-based foods. These include red sauce, chili, salsa, and pizza with red sauce. Fried and fatty foods. These include donuts, french fries, potato chips, and high-fat dressings. High-fat meats. These include hot dogs, rib eye steak, sausage, ham, and bacon. High-fat dairy items, such as whole milk, butter, and cream cheese. Eat small meals often. Avoid eating large meals. Avoid drinking large amounts of liquid with your meals. Avoid eating meals during the 2-3 hours before bedtime. Avoid lying down right after you eat. Do not exercise right after you eat. Lifestyle  Do not use any products that contain nicotine or tobacco. These include cigarettes, e-cigarettes, and chewing tobacco. If you need help quitting, ask your doctor. Try to lower your stress. If you need help doing this, ask your doctor. If you are overweight, lose an amount of weight that is healthy for you. Ask your doctor about a safe weight loss goal. General instructions Pay attention to any changes in your symptoms. Take over-the-counter and prescription medicines only as told by your doctor. Do not take aspirin, ibuprofen, or other NSAIDs unless your doctor says it  is okay. Wear loose clothes. Do not wear anything tight around your waist. Raise (elevate) the head of your bed about 6 inches (15 cm). Avoid bending over  if this makes your symptoms worse. Keep all follow-up visits as told by your doctor. This is important. Contact a doctor if: You have new symptoms. You lose weight and you do not know why. You have trouble swallowing or it hurts to swallow. You have wheezing or a cough that keeps happening. Your symptoms do not get better with treatment. You have a hoarse voice. Get help right away if: You have pain in your arms, neck, jaw, teeth, or back. You feel sweaty, dizzy, or light-headed. You have chest pain or shortness of breath. You throw up (vomit) and your throw-up looks like blood or coffee grounds. You pass out (faint). Your poop (stool) is bloody or black. You cannot swallow, drink, or eat. Summary If a person has gastroesophageal reflux disease (GERD), food and stomach acid move back up into the esophagus and cause symptoms or problems such as damage to the esophagus. Treatment will depend on how bad your symptoms are. Follow a diet as told by your doctor. Take all medicines only as told by your doctor. This information is not intended to replace advice given to you by your health care provider. Make sure you discuss any questions you have with your health care provider. Document Revised: 08/15/2017 Document Reviewed: 08/15/2017 Elsevier Patient Education  [place] refers to food and lifestyle choices that are based on the traditions of countries located on the [employer]. This way of eating has been shown to help prevent certain conditions and improve outcomes for people who have chronic diseases, like kidney disease and heart disease. What are tips for following this plan? Lifestyle  Cook and eat meals together with your family, when possible.  Drink enough fluid to keep your urine clear or pale yellow.  Be physically active every day. This includes: ? Aerobic exercise like running or swimming. ? Leisure activities like  gardening, walking, or housework.  Get 7-8 hours of sleep each night.  If recommended by your health care provider, drink red wine in moderation. This means 1 glass a day for nonpregnant women and 2 glasses a day for men. A glass of wine equals 5 oz (150 mL). Reading food labels   Check the serving size of packaged foods. For foods such as rice and pasta, the serving size refers to the amount of cooked product, not dry.  Check the total fat in packaged foods. Avoid foods that have saturated fat or trans fats.  Check the ingredients list for added sugars, such as corn syrup. Shopping  At the grocery store, buy most of your food from the areas near the walls of the store. This includes: ? Fresh fruits and vegetables (produce). ? Grains, beans, nuts, and seeds. Some of these may be available in unpackaged forms or large amounts (in bulk). ? Fresh seafood. ? Poultry and eggs. ? Low-fat dairy products.  Buy whole ingredients instead of prepackaged foods.  Buy fresh fruits and vegetables in-season from local farmers markets.  Buy frozen fruits and vegetables in resealable bags.  If you do not have access to quality fresh seafood, buy precooked frozen shrimp or canned fish, such as tuna, salmon, or sardines.  Buy small amounts of raw or cooked vegetables, salads, or olives from the deli or salad bar at your store.  Stock your  pantry so you always have certain foods on hand, such as olive oil, canned tuna, canned tomatoes, rice, pasta, and beans. Cooking  Cook foods with extra-virgin olive oil instead of using butter or other vegetable oils.  Have meat as a side dish, and have vegetables or grains as your main dish. This means having meat in small portions or adding small amounts of meat to foods like pasta or stew.  Use beans or vegetables instead of meat in common dishes like chili or lasagna.  Experiment with different cooking methods. Try roasting or broiling vegetables instead  of steaming or sauteing them.  Add frozen vegetables to soups, stews, pasta, or rice.  Add nuts or seeds for added healthy fat at each meal. You can add these to yogurt, salads, or vegetable dishes.  Marinate fish or vegetables using olive oil, lemon juice, garlic, and fresh herbs. Meal planning   Plan to eat 1 vegetarian meal one day each week. Try to work up to 2 vegetarian meals, if possible.  Eat seafood 2 or more times a week.  Have healthy snacks readily available, such as: ? Vegetable sticks with hummus. ? [place] yogurt. ? Fruit and nut trail mix.  Eat balanced meals throughout the week. This includes: ? Fruit: 2-3 servings a day ? Vegetables: 4-5 servings a day ? Low-fat dairy: 2 servings a day ? Fish, poultry, or lean meat: 1 serving a day ? Beans and legumes: 2 or more servings a week ? Nuts and seeds: 1-2 servings a day ? Whole grains: 6-8 servings a day ? Extra-virgin olive oil: 3-4 servings a day  Limit red meat and sweets to only a few servings a month What are my food choices?  Mediterranean diet ? Recommended  Grains: Whole-grain pasta. Brown rice. Bulgar wheat. Polenta. Couscous. Whole-wheat bread. [place].  Vegetables: Artichokes. Beets. Broccoli. Cabbage. Carrots. Eggplant. Green beans. Chard. Kale. Spinach. Onions. Leeks. Peas. Squash. Tomatoes. Peppers. Radishes.  Fruits: Apples. Apricots. Avocado. Berries. Bananas. Cherries. Dates. Figs. Grapes. Lemons. Melon. Oranges. Peaches. Plums. Pomegranate.  Meats and other protein foods: Beans. Almonds. Sunflower seeds. Pine nuts. Peanuts. [place]. Salmon. Scallops. Shrimp. [place]. Tilapia. Clams. Oysters. Eggs.  Dairy: Low-fat milk. Cheese. Greek yogurt.  Beverages: Water. Red wine. Herbal tea.  Fats and oils: Extra virgin olive oil. Avocado oil. Grape seed oil.  Sweets and desserts: [place] yogurt with honey. Baked apples. Poached pears. Trail mix.  Seasoning and other foods: Basil. Cilantro.  Coriander. Cumin. Mint. Parsley. Sage. Rosemary. Tarragon. Garlic. Oregano. Thyme. Pepper. Balsalmic vinegar. Tahini. Hummus. Tomato sauce. Olives. Mushrooms. ? Limit these  Grains: Prepackaged pasta or rice dishes. Prepackaged cereal with added sugar.  Vegetables: Deep fried potatoes (french fries).  Fruits: Fruit canned in syrup.  Meats and other protein foods: Beef. Pork. Lamb. Poultry with skin. Hot dogs. [place] Salines.  Dairy: Ice cream. Sour cream. Whole milk.  Beverages: Juice. Sugar-sweetened soft drinks. Beer. Liquor and spirits.  Fats and oils: Butter. Canola oil. Vegetable oil. Beef fat (tallow). Lard.  Sweets and desserts: Cookies. Cakes. Pies. Candy.  Seasoning and other foods: Mayonnaise. Premade sauces and marinades. The items listed may not be a complete list. Talk with your dietitian about what dietary choices are right for you. Summary  The Mediterranean diet includes both food and lifestyle choices.  Eat a variety of fresh fruits and vegetables, beans, nuts, seeds, and whole grains.  Limit the amount of red meat and sweets that you eat.  Talk with your health care provider about  whether it is safe for you to drink red wine in moderation. This means 1 glass a day for nonpregnant women and 2 glasses a day for men. A glass of wine equals 5 oz (150 mL). This information is not intended to replace advice given to you by your health care provider. Make sure you discuss any questions you have with your health care provider. Document Revised: 10/07/2015 Document Reviewed: 09/30/2015 Elsevier Patient Education  [place].

## 2019-06-19 LAB — LIPID PANEL
Cholesterol: 231 mg/dL — ABNORMAL HIGH (ref <200)
HDL: 49 mg/dL (ref >=40)
LDL Cholesterol (Calc): 163 mg/dL (calc) — ABNORMAL HIGH
Non-HDL Cholesterol (Calc): 182 mg/dL (calc) — ABNORMAL HIGH (ref <130)
Total CHOL/HDL Ratio: 4.7 (calc) (ref <5.0)
Triglycerides: 88 mg/dL (ref <150)

## 2019-06-19 LAB — CBC WITH DIFFERENTIAL/PLATELET
Absolute Monocytes: 587 cells/uL (ref 200–950)
Basophils Absolute: 40 cells/uL (ref 0–200)
Basophils Relative: 0.6 %
Eosinophils Absolute: 59 cells/uL (ref 15–500)
Eosinophils Relative: 0.9 %
HCT: 48 % (ref 38.5–50.0)
Hemoglobin: 16.2 g/dL (ref 13.2–17.1)
Lymphs Abs: 1208 cells/uL (ref 850–3900)
MCH: 31.5 pg (ref 27.0–33.0)
MCHC: 33.8 g/dL (ref 32.0–36.0)
MCV: 93.2 fL (ref 80.0–100.0)
MPV: 9.4 fL (ref 7.5–12.5)
Monocytes Relative: 8.9 %
Neutro Abs: 4706 cells/uL (ref 1500–7800)
Neutrophils Relative %: 71.3 %
Platelets: 428 10*3/uL — ABNORMAL HIGH (ref 140–400)
RBC: 5.15 10*6/uL (ref 4.20–5.80)
RDW: 12.9 % (ref 11.0–15.0)
Total Lymphocyte: 18.3 %
WBC: 6.6 10*3/uL (ref 3.8–10.8)

## 2019-06-19 LAB — HEPATITIS C ANTIBODY
Hepatitis C Ab: NONREACTIVE
SIGNAL TO CUT-OFF: 0.01 (ref <1.00)

## 2019-06-19 LAB — URINALYSIS, COMPLETE
Bacteria, UA: NONE SEEN /HPF
Bilirubin Urine: NEGATIVE
Hgb urine dipstick: NEGATIVE
Hyaline Cast: NONE SEEN /LPF
Ketones, ur: NEGATIVE
Leukocytes,Ua: NEGATIVE
Nitrite: NEGATIVE
Protein, ur: NEGATIVE
RBC / HPF: NONE SEEN /HPF (ref 0–2)
Specific Gravity, Urine: 1.029 (ref 1.001–1.03)
Squamous Epithelial / LPF: NONE SEEN /HPF (ref <=5)
WBC, UA: NONE SEEN /HPF (ref 0–5)
pH: 6 (ref 5.0–8.0)

## 2019-06-19 LAB — HIV ANTIBODY (ROUTINE TESTING W REFLEX): HIV 1&2 Ab, 4th Generation: NONREACTIVE

## 2019-06-19 LAB — COMPLETE METABOLIC PANEL WITH GFR
AG Ratio: 1.7 (calc) (ref 1.0–2.5)
ALT: 21 U/L (ref 9–46)
AST: 14 U/L (ref 10–35)
Albumin: 4.7 g/dL (ref 3.6–5.1)
Alkaline phosphatase (APISO): 60 U/L (ref 35–144)
BUN: 24 mg/dL (ref 7–25)
CO2: 29 mmol/L (ref 20–32)
Calcium: 10 mg/dL (ref 8.6–10.3)
Chloride: 101 mmol/L (ref 98–110)
Creat: 0.92 mg/dL (ref 0.70–1.33)
GFR, Est African American: 110 mL/min/{1.73_m2} (ref >=60)
GFR, Est Non African American: 95 mL/min/{1.73_m2} (ref >=60)
Globulin: 2.7 g/dL (calc) (ref 1.9–3.7)
Glucose, Bld: 109 mg/dL — ABNORMAL HIGH (ref 65–99)
Potassium: 4.5 mmol/L (ref 3.5–5.3)
Sodium: 138 mmol/L (ref 135–146)
Total Bilirubin: 0.7 mg/dL (ref 0.2–1.2)
Total Protein: 7.4 g/dL (ref 6.1–8.1)

## 2019-06-19 LAB — TEST AUTHORIZATION: TEST CODE:: 5363

## 2019-06-19 LAB — PSA: PSA: 1 ng/mL (ref <=4.0)

## 2019-06-19 LAB — TSH: TSH: 1.34 mIU/L (ref 0.40–4.50)

## 2019-06-20 ENCOUNTER — Telehealth: Payer: Self-pay

## 2019-06-20 NOTE — Telephone Encounter (Signed)
Returned patient's call, he was given his lab results.  Copied from [place] [id number]. Topic: General - Other >> Jun 20, 2019  8:41 AM Alanda Slim E wrote: Reason for CRM: Pt returned Coralee Edberg's call about his labs/ please advise

## 2019-07-15 DIAGNOSIS — R739 Hyperglycemia, unspecified: Secondary | ICD-10-CM | POA: Insufficient documentation

## 2019-07-15 DIAGNOSIS — E6609 Other obesity due to excess calories: Secondary | ICD-10-CM | POA: Insufficient documentation

## 2019-07-15 DIAGNOSIS — E782 Mixed hyperlipidemia: Secondary | ICD-10-CM | POA: Insufficient documentation

## 2019-07-15 NOTE — Progress Notes (Signed)
Patient ID: Todd Castaneda, male    DOB: 12-04-1965, 54 y.o.   MRN: [id number]  PCP: Towanda Malkin, MD  Chief Complaint  Patient presents with  . Follow-up  . Obesity  . Hyperlipidemia  . Gastroesophageal Reflux  . Hyperglycemia  . Hypertension    Subjective:   Todd Castaneda is a 54 y.o. male, presents to clinic with CC of the following:  Chief Complaint  Patient presents with  . Follow-up  . Obesity  . Hyperlipidemia  . Gastroesophageal Reflux  . Hyperglycemia  . Hypertension    HPI:  Patient is a 54 year old male who established with this practice on 06/18/2019 His blood pressure was borderline high on the initial visit, and asked to get some readings on the outside to help on follow-up today. Also his glucose was slightly elevated with glucose noted in the urine, and need to check an A1c today on follow-up.  In addition his lipids were abnormal. He follows up today.  Obesity Weight has remained stable, still noted he desires to lose some weight moving forward.  Also some form of regular physical activity important and again emphasized today Wt Readings from Last 3 Encounters:  07/16/19 213 lb 3.2 oz (96.7 kg)  06/18/19 212 lb 9.6 oz (96.4 kg)  04/02/18 220 lb (99.8 kg)    BP elevation No h/o HTN in his past Has checked his blood pressure on the outside a couple times, with the readings with bottom number below 90, top was 180 one time  No CP, palp, SOB, LE swelling, no increased headaches, vision changes (notes he does use reading glasses to help now) BP Readings from Last 3 Encounters:  07/16/19 132/82  06/18/19 (!) 150/90  04/02/18 (!) 148/82    GERD Continues taking OTC Pepcid, often takes one in am and helps all day. Bad heartburn often if not take one. Tries to avoid spicy foods to help. Been last few years at least that he has had the symptoms intermittently, and continues using the Pepcid product which is helpful.   No dark or black  stools, no BRBPR, no chronic abd pains, no N/V  Hyperlipidemia -both cholesterol and LDL cholesterol elevated on recent check Lab Results  Component Value Date   CHOL 231 (H) 06/18/2019   HDL 49 06/18/2019   LDLCALC 163 (H) 06/18/2019   TRIG 88 06/18/2019   CHOLHDL 4.7 06/18/2019   Hyperglycemia/glucosuria Lab Results  Component Value Date   GLUCOSE 109 (H) 06/18/2019   NA 138 06/18/2019   K 4.5 06/18/2019   CL 101 06/18/2019   CO2 29 06/18/2019   BUN 24 06/18/2019   CREATININE 0.92 06/18/2019   GFRNONAA 95 06/18/2019   GFRAA 110 06/18/2019   CALCIUM 10.0 06/18/2019   PROT 7.4 06/18/2019   ALBUMIN 3.9 08/22/2013   BILITOT 0.7 06/18/2019   ALKPHOS 69 08/22/2013   AST 14 06/18/2019   ALT 21 06/18/2019   ANIONGAP 10 08/22/2013   Denies any history of diabetes, no recent increased thirst, increased urination, numbness or tingling in the extremities + FH noted  ED Notes he is able to get erections, often do not last.  Medication regimen -sildenafil Notes is helpful, taking two presently when uses  Prostate cancer screening Patient has followed PSAs in the past for this, discussed role, and will continue to follow with most recent check good Does not get up overnight to urinate, denies marked hesitancy, frequency, urgency symptoms. Notes she still has  good urine flow. No family history of prostate cancer Lab Results  Component Value Date   PSA 1.0 06/18/2019    Colon cancer screening Has not had a colonoscopy or any other form of screening in his past.  Discussed options with him including a colonoscopy, versus a Cologuard product last visit.  He is okay proceeding with the colonoscopy for screening purposes No family history of colon cancer Denies any recent black stools, dark stools, bleeding per rectum, no chronic abdominal pains, no chronic constipation or diarrhea.   Diet- no change since last visit, again rec'ed changes to eat healthy with more information  provided in the AVS as well Exercise-a lot at work, does hardwood floors, none outside of work, wants to start walking still but hasn't started. Tobacco-former smoker Alcohol-denied use He noted he lives with his son, is monogamous with his current partner,   Patient Active Problem List   Diagnosis Date Noted  . Prediabetes 07/16/2019  . Hyperglycemia 07/15/2019  . Mixed hyperlipidemia 07/15/2019  . Class 1 obesity due to excess calories with serious comorbidity and body mass index (BMI) of 33.0 to 33.9 in adult 07/15/2019  . Elevated BP without diagnosis of hypertension 06/18/2019  . Erectile dysfunction 06/18/2019  . Gastroesophageal reflux disease without esophagitis 06/18/2019      Current Outpatient Medications:  .  famotidine (PEPCID) 20 MG tablet, Take 1 tablet (20 mg total) by mouth 2 (two) times daily., Disp: 14 tablet, Rfl: 0 .  sildenafil (REVATIO) 20 MG tablet, Take 1-3 tabs 0.5-2 hours prior to planned sexual activity, Disp: 30 tablet, Rfl: 5 .  atorvastatin (LIPITOR) 10 MG tablet, Take 1 tablet (10 mg total) by mouth daily., Disp: 90 tablet, Rfl: 3   Allergies  Allergen Reactions  . Augmentin [Amoxicillin-Pot Clavulanate] Hives     History reviewed. No pertinent surgical history.   Family History  Problem Relation Age of Onset  . Diabetes Mother      Social History   Tobacco Use  . Smoking status: Former Research scientist (life sciences)  . Smokeless tobacco: Never Used  Substance Use Topics  . Alcohol use: Never    With staff assistance, above reviewed with the patient today.  ROS: As per HPI, otherwise no specific complaints on a limited and focused system review   Results for orders placed or performed in visit on 07/16/19 (from the past 72 hour(s))  POCT glycosylated hemoglobin (Hb A1C)     Status: Normal   Collection Time: 07/16/19  8:07 AM  Result Value Ref Range   Hemoglobin A1C 5.5 4.0 - 5.6 %   HbA1c POC (<> result, manual entry)     HbA1c, POC (prediabetic  range)     HbA1c, POC (controlled diabetic range)       PHQ2/9: Depression screen [facility] 2/9 07/16/2019 06/18/2019  Decreased Interest 0 0  Down, Depressed, Hopeless 0 0  PHQ - 2 Score 0 0  Altered sleeping 0 0  Tired, decreased energy 0 1  Change in appetite 0 0  Feeling bad or failure about yourself  0 0  Trouble concentrating 0 0  Moving slowly or fidgety/restless 0 0  Suicidal thoughts 0 0  PHQ-9 Score 0 1  Difficult doing work/chores Not difficult at all Not difficult at all   PHQ-2/9 Result is neg  Fall Risk: Fall Risk  07/16/2019 06/18/2019  Falls in the past year? 0 0  Number falls in past yr: 0 0  Injury with Fall? 0 0  Objective:   Vitals:   07/16/19 0743  BP: 132/82  Pulse: 74  Resp: 16  Temp: 97.9 F (36.6 C)  TempSrc: Temporal  SpO2: 99%  Weight: 213 lb 3.2 oz (96.7 kg)  Height: 5\' 7"  (1.702 m)    Body mass index is 33.39 kg/m. Rechecked blood pressure by myself was 148/80  Physical Exam   NAD, masked, pleasant HEENT - [place]/AT, sclera anicteric, PERRL, EOMI, conj - non-inj'ed, TM's and canals clear, pharynx clear, poor dentition Neck - supple, no adenopathy, carotids 2+ and = without bruits bilat Car - RRR without m/g/r Pulm- RR and effort normal at rest, CTA without wheeze or rales Abd - soft, NT, mildly obese, ND, BS+,  no masses Back - no CVA tenderness Ext - no LE edema, no active joints Neuro/psychiatric - affect was not flat, appropriate with conversation  Alert and oriented  Grossly non-focal   Speech normal   Results for orders placed or performed in visit on 07/16/19  POCT glycosylated hemoglobin (Hb A1C)  Result Value Ref Range   Hemoglobin A1C 5.5 4.0 - 5.6 %   HbA1c POC (<> result, manual entry)     HbA1c, POC (prediabetic range)     HbA1c, POC (controlled diabetic range)     A1C - 5.5    Assessment & Plan:    1. Mixed hyperlipidemia High cholesterol and high LDL cholesterol were noted on his lipid panel.  Discussed  desired ranges, and concerns with his prediabetes diagnosis with getting this better controlled.   Do feel adding a statin product would be helpful, and Lipitor was prescribed for him to start 10 mg daily.  Recommended taking at nighttime. Also emphasized the importance of dietary modifications with information provided in the AVS as well as increasing his physical activity levels to help in addition.   2. Hyperglycemia/Glucosuria - Prediabetes The A1c checked today was 5.5.  Did note concerns for prediabetes, and educated on him at length today with this and the importance of dietary modifications and increasing physical activity levels to help with weight management and possible weight loss. Will not add a medication today although did briefly discuss medications that are often tried and may be needed in the very near future. He noted his wife is on insulin for diabetes, and is aware of diabetes and the importance of controlling blood sugars over time. Continue to closely monitor. - POCT glycosylated hemoglobin (Hb A1C)  3. Elevated BP without diagnosis of hypertension For systolic blood pressure seemed to be more labile, and slightly higher at times than desired.  Discussed this with him today, and we will hold on adding a medicine at this point, although may need 1 in the near future pending further follow-ups. Asked that he continue to have blood pressure checks on the outside and write them down to help monitor, Also dietary modifications and physical activity increases recommend will help with his blood pressure control.  4. Gastroesophageal reflux disease without esophagitis Stable, and continue the Pepcid product as he is taking  5. Erectile dysfunction, unspecified erectile dysfunction type Continue the sildenafil product it seems to be helpful.  I did note if he does not feel it is as effective, he can try 3 tablets prior to see if that is more helpful.  6. Class 1 obesity due to  excess calories with serious comorbidity and body mass index (BMI) of 33.0 to 33.9 in adult As above, weight management is very important with the conditions noted, and  some weight loss would be helpful also noted.  Again, the dietary modifications and physical activity increases were strongly encouraged.  7. Screening for colorectal cancer Again discussed options, and he will proceed with a colonoscopy, and GI referral was provided.  We will schedule follow-up in 4 months time, sooner as needed, and emphasized today he likely will need further medicines to help with diabetes concerns and blood pressure control over time, with this hopefully providing some motivation to do a better job with dietary modifications and increasing physical activity levels in the very near future.     Towanda Malkin, MD 07/16/19 10:02 AM

## 2019-07-16 ENCOUNTER — Encounter: Payer: Self-pay | Admitting: Internal Medicine

## 2019-07-16 ENCOUNTER — Other Ambulatory Visit: Payer: Self-pay

## 2019-07-16 ENCOUNTER — Ambulatory Visit: Payer: 59 | Admitting: Internal Medicine

## 2019-07-16 VITALS — BP 132/82 | HR 74 | Temp 97.9°F | Resp 16 | Ht 67.0 in | Wt 213.2 lb

## 2019-07-16 DIAGNOSIS — K219 Gastro-esophageal reflux disease without esophagitis: Secondary | ICD-10-CM

## 2019-07-16 DIAGNOSIS — R739 Hyperglycemia, unspecified: Secondary | ICD-10-CM | POA: Diagnosis not present

## 2019-07-16 DIAGNOSIS — R030 Elevated blood-pressure reading, without diagnosis of hypertension: Secondary | ICD-10-CM

## 2019-07-16 DIAGNOSIS — Z6833 Body mass index (BMI) 33.0-33.9, adult: Secondary | ICD-10-CM

## 2019-07-16 DIAGNOSIS — E782 Mixed hyperlipidemia: Secondary | ICD-10-CM

## 2019-07-16 DIAGNOSIS — R7303 Prediabetes: Secondary | ICD-10-CM

## 2019-07-16 DIAGNOSIS — Z1212 Encounter for screening for malignant neoplasm of rectum: Secondary | ICD-10-CM

## 2019-07-16 DIAGNOSIS — Z1211 Encounter for screening for malignant neoplasm of colon: Secondary | ICD-10-CM

## 2019-07-16 DIAGNOSIS — E6609 Other obesity due to excess calories: Secondary | ICD-10-CM

## 2019-07-16 DIAGNOSIS — N529 Male erectile dysfunction, unspecified: Secondary | ICD-10-CM

## 2019-07-16 LAB — POCT GLYCOSYLATED HEMOGLOBIN (HGB A1C): Hemoglobin A1C: 5.5 % (ref 4.0–5.6)

## 2019-07-16 MED ORDER — ATORVASTATIN CALCIUM 10 MG PO TABS: 10.0000 mg | ORAL_TABLET | Freq: Every day | ORAL | 3 refills | Status: DC

## 2019-07-16 NOTE — Patient Instructions (Signed)
High Cholesterol  High cholesterol is a condition in which the blood has high levels of a white, waxy, fat-like substance (cholesterol). The human body needs small amounts of cholesterol. The liver makes all the cholesterol that the body needs. Extra (excess) cholesterol comes from the food that we eat. Cholesterol is carried from the liver by the blood through the blood vessels. If you have high cholesterol, deposits (plaques) may build up on the walls of your blood vessels (arteries). Plaques make the arteries narrower and stiffer. Cholesterol plaques increase your risk for heart attack and stroke. Work with your health care provider to keep your cholesterol levels in a healthy range. What increases the risk? This condition is more likely to develop in people who:  Eat foods that are high in animal fat (saturated fat) or cholesterol.  Are overweight.  Are not getting enough exercise.  Have a family history of high cholesterol. What are the signs or symptoms? There are no symptoms of this condition. How is this diagnosed? This condition may be diagnosed from the results of a blood test.  If you are older than age 20, your health care provider may check your cholesterol every 4-6 years.  You may be checked more often if you already have high cholesterol or other risk factors for heart disease. The blood test for cholesterol measures:  "Bad" cholesterol (LDL cholesterol). This is the main type of cholesterol that causes heart disease. The desired level for LDL is less than 100.  "Good" cholesterol (HDL cholesterol). This type helps to protect against heart disease by cleaning the arteries and carrying the LDL away. The desired level for HDL is 60 or higher.  Triglycerides. These are fats that the body can store or burn for energy. The desired number for triglycerides is lower than 150.  Total cholesterol. This is a measure of the total amount of cholesterol in your blood, including LDL  cholesterol, HDL cholesterol, and triglycerides. A healthy number is less than 200. How is this treated? This condition is treated with diet changes, lifestyle changes, and medicines. Diet changes  This may include eating more whole grains, fruits, vegetables, nuts, and fish.  This may also include cutting back on red meat and foods that have a lot of added sugar. Lifestyle changes  Changes may include getting at least 40 minutes of aerobic exercise 3 times a week. Aerobic exercises include walking, biking, and swimming. Aerobic exercise along with a healthy diet can help you maintain a healthy weight.  Changes may also include quitting smoking. Medicines  Medicines are usually given if diet and lifestyle changes have failed to reduce your cholesterol to healthy levels.  Your health care provider may prescribe a statin medicine. Statin medicines have been shown to reduce cholesterol, which can reduce the risk of heart disease. Follow these instructions at home: Eating and drinking If told by your health care provider:  Eat chicken (without skin), fish, veal, shellfish, ground turkey breast, and round or loin cuts of red meat.  Do not eat fried foods or fatty meats, such as hot dogs and salami.  Eat plenty of fruits, such as apples.  Eat plenty of vegetables, such as broccoli, potatoes, and carrots.  Eat beans, peas, and lentils.  Eat grains such as barley, rice, couscous, and bulgur wheat.  Eat pasta without cream sauces.  Use skim or nonfat milk, and eat low-fat or nonfat yogurt and cheeses.  Do not eat or drink whole milk, cream, ice cream, egg yolks,   or hard cheeses.  Do not eat stick margarine or tub margarines that contain trans fats (also called partially hydrogenated oils).  Do not eat saturated tropical oils, such as coconut oil and palm oil.  Do not eat cakes, cookies, crackers, or other baked goods that contain trans fats.  General instructions  Exercise as  directed by your health care provider. Increase your activity level with activities such as gardening, walking, and taking the stairs.  Take over-the-counter and prescription medicines only as told by your health care provider.  Do not use any products that contain nicotine or tobacco, such as cigarettes and e-cigarettes. If you need help quitting, ask your health care provider.  Keep all follow-up visits as told by your health care provider. This is important. Contact a health care provider if:  You are struggling to maintain a healthy diet or weight.  You need help to start on an exercise program.  You need help to stop smoking. Get help right away if:  You have chest pain.  You have trouble breathing. This information is not intended to replace advice given to you by your health care provider. Make sure you discuss any questions you have with your health care provider. Document Revised: 02/09/2017 Document Reviewed: 08/07/2015 Elsevier Patient Education  2020 Elsevier Inc.   Diabetes Mellitus and Nutrition, Adult When you have diabetes (diabetes mellitus), it is very important to have healthy eating habits because your blood sugar (glucose) levels are greatly affected by what you eat and drink. Eating healthy foods in the appropriate amounts, at about the same times every day, can help you:  Control your blood glucose.  Lower your risk of heart disease.  Improve your blood pressure.  Reach or maintain a healthy weight. Every person with diabetes is different, and each person has different needs for a meal plan. Your health care provider may recommend that you work with a diet and nutrition specialist (dietitian) to make a meal plan that is best for you. Your meal plan may vary depending on factors such as:  The calories you need.  The medicines you take.  Your weight.  Your blood glucose, blood pressure, and cholesterol levels.  Your activity level.  Other health  conditions you have, such as heart or kidney disease. How do carbohydrates affect me? Carbohydrates, also called carbs, affect your blood glucose level more than any other type of food. Eating carbs naturally raises the amount of glucose in your blood. Carb counting is a method for keeping track of how many carbs you eat. Counting carbs is important to keep your blood glucose at a healthy level, especially if you use insulin or take certain oral diabetes medicines. It is important to know how many carbs you can safely have in each meal. This is different for every person. Your dietitian can help you calculate how many carbs you should have at each meal and for each snack. Foods that contain carbs include:  Bread, cereal, rice, pasta, and crackers.  Potatoes and corn.  Peas, beans, and lentils.  Milk and yogurt.  Fruit and juice.  Desserts, such as cakes, cookies, ice cream, and candy. How does alcohol affect me? Alcohol can cause a sudden decrease in blood glucose (hypoglycemia), especially if you use insulin or take certain oral diabetes medicines. Hypoglycemia can be a life-threatening condition. Symptoms of hypoglycemia (sleepiness, dizziness, and confusion) are similar to symptoms of having too much alcohol. If your health care provider says that alcohol is safe for   you, follow these guidelines:  Limit alcohol intake to no more than 1 drink per day for nonpregnant women and 2 drinks per day for men. One drink equals 12 oz of beer, 5 oz of wine, or 1 oz of hard liquor.  Do not drink on an empty stomach.  Keep yourself hydrated with water, diet soda, or unsweetened iced tea.  Keep in mind that regular soda, juice, and other mixers may contain a lot of sugar and must be counted as carbs. What are tips for following this plan?  Reading food labels  Start by checking the serving size on the "Nutrition Facts" label of packaged foods and drinks. The amount of calories, carbs, fats, and  other nutrients listed on the label is based on one serving of the item. Many items contain more than one serving per package.  Check the total grams (g) of carbs in one serving. You can calculate the number of servings of carbs in one serving by dividing the total carbs by 15. For example, if a food has 30 g of total carbs, it would be equal to 2 servings of carbs.  Check the number of grams (g) of saturated and trans fats in one serving. Choose foods that have low or no amount of these fats.  Check the number of milligrams (mg) of salt (sodium) in one serving. Most people should limit total sodium intake to less than 2,300 mg per day.  Always check the nutrition information of foods labeled as "low-fat" or "nonfat". These foods may be higher in added sugar or refined carbs and should be avoided.  Talk to your dietitian to identify your daily goals for nutrients listed on the label. Shopping  Avoid buying canned, premade, or processed foods. These foods tend to be high in fat, sodium, and added sugar.  Shop around the outside edge of the grocery store. This includes fresh fruits and vegetables, bulk grains, fresh meats, and fresh dairy. Cooking  Use low-heat cooking methods, such as baking, instead of high-heat cooking methods like deep frying.  Cook using healthy oils, such as olive, canola, or sunflower oil.  Avoid cooking with butter, cream, or high-fat meats. Meal planning  Eat meals and snacks regularly, preferably at the same times every day. Avoid going long periods of time without eating.  Eat foods high in fiber, such as fresh fruits, vegetables, beans, and whole grains. Talk to your dietitian about how many servings of carbs you can eat at each meal.  Eat 4-6 ounces (oz) of lean protein each day, such as lean meat, chicken, fish, eggs, or tofu. One oz of lean protein is equal to: ? 1 oz of meat, chicken, or fish. ? 1 egg. ?  cup of tofu.  Eat some foods each day that  contain healthy fats, such as avocado, nuts, seeds, and fish. Lifestyle  Check your blood glucose regularly.  Exercise regularly as told by your health care provider. This may include: ? 150 minutes of moderate-intensity or vigorous-intensity exercise each week. This could be brisk walking, biking, or water aerobics. ? Stretching and doing strength exercises, such as yoga or weightlifting, at least 2 times a week.  Take medicines as told by your health care provider.  Do not use any products that contain nicotine or tobacco, such as cigarettes and e-cigarettes. If you need help quitting, ask your health care provider.  Work with a Social worker or diabetes educator to identify strategies to manage stress and any emotional and social  challenges. Questions to ask a health care provider  Do I need to meet with a diabetes educator?  Do I need to meet with a dietitian?  What number can I call if I have questions?  When are the best times to check my blood glucose? Where to find more information:  American Diabetes Association: diabetes.org  Academy of Nutrition and Dietetics: www.eatright.org  National Institute of Diabetes and Digestive and Kidney Diseases (NIH): www.niddk.nih.gov Summary  A healthy meal plan will help you control your blood glucose and maintain a healthy lifestyle.  Working with a diet and nutrition specialist (dietitian) can help you make a meal plan that is best for you.  Keep in mind that carbohydrates (carbs) and alcohol have immediate effects on your blood glucose levels. It is important to count carbs and to use alcohol carefully. This information is not intended to replace advice given to you by your health care provider. Make sure you discuss any questions you have with your health care provider. Document Revised: 01/19/2017 Document Reviewed: 03/13/2016 Elsevier Patient Education  2020 Elsevier Inc.   

## 2019-07-30 ENCOUNTER — Telehealth (INDEPENDENT_AMBULATORY_CARE_PROVIDER_SITE_OTHER): Payer: Self-pay | Admitting: Gastroenterology

## 2019-07-30 ENCOUNTER — Other Ambulatory Visit: Payer: Self-pay

## 2019-07-30 DIAGNOSIS — Z1211 Encounter for screening for malignant neoplasm of colon: Secondary | ICD-10-CM

## 2019-07-30 NOTE — Progress Notes (Signed)
Gastroenterology Pre-Procedure Review  Request Date: Friday 09/12/19 Requesting Physician: Dr. Vicente Males  PATIENT REVIEW QUESTIONS: The patient responded to the following health history questions as indicated:    1. Are you having any GI issues? no 2. Do you have a personal history of Polyps? no 3. Do you have a family history of Colon Cancer or Polyps? no 4. Diabetes Mellitus? no 5. Joint replacements in the past 12 months?no 6. Major health problems in the past 3 months?no 7. Any artificial heart valves, MVP, or defibrillator?no    MEDICATIONS & ALLERGIES:    Patient reports the following regarding taking any anticoagulation/antiplatelet therapy:   Plavix, Coumadin, Eliquis, Xarelto, Lovenox, Pradaxa, Brilinta, or Effient? no Aspirin? no  Patient confirms/reports the following medications:  Current Outpatient Medications  Medication Sig Dispense Refill  . atorvastatin (LIPITOR) 10 MG tablet Take 1 tablet (10 mg total) by mouth daily. 90 tablet 3  . famotidine (PEPCID) 20 MG tablet Take 1 tablet (20 mg total) by mouth 2 (two) times daily. 14 tablet 0  . sildenafil (REVATIO) 20 MG tablet Take 1-3 tabs 0.5-2 hours prior to planned sexual activity 30 tablet 5   No current facility-administered medications for this visit.    Patient confirms/reports the following allergies:  Allergies  Allergen Reactions  . Augmentin [Amoxicillin-Pot Clavulanate] Hives    No orders of the defined types were placed in this encounter.   AUTHORIZATION INFORMATION Primary Insurance: 1D#: Group #:  Secondary Insurance: 1D#: Group #:  SCHEDULE INFORMATION: Date: Friday 09/12/19 Time: Location:[facility]

## 2019-09-10 ENCOUNTER — Other Ambulatory Visit
Admission: RE | Admit: 2019-09-10 | Discharge: 2019-09-10 | Disposition: A | Discharge status: Home / Self Care | Payer: 59 | Source: Ambulatory Visit | Attending: Gastroenterology | Admitting: Gastroenterology

## 2019-09-10 ENCOUNTER — Other Ambulatory Visit: Payer: Self-pay

## 2019-09-10 DIAGNOSIS — Z20822 Contact with and (suspected) exposure to covid-19: Secondary | ICD-10-CM | POA: Diagnosis not present

## 2019-09-10 DIAGNOSIS — Z01812 Encounter for preprocedural laboratory examination: Secondary | ICD-10-CM | POA: Diagnosis not present

## 2019-09-11 ENCOUNTER — Encounter: Payer: Self-pay | Admitting: Gastroenterology

## 2019-09-11 LAB — SARS CORONAVIRUS 2 (TAT 6-24 HRS): SARS Coronavirus 2: NEGATIVE

## 2019-09-12 ENCOUNTER — Ambulatory Visit: Payer: 59 | Admitting: Anesthesiology

## 2019-09-12 ENCOUNTER — Ambulatory Visit
Admission: RE | Admit: 2019-09-12 | Discharge: 2019-09-12 | Disposition: A | Discharge status: Home / Self Care | Payer: 59 | Attending: Gastroenterology | Admitting: Gastroenterology

## 2019-09-12 ENCOUNTER — Encounter
Admission: RE | Disposition: A | Discharge status: Home / Self Care | Payer: Self-pay | Source: Home / Self Care | Attending: Gastroenterology

## 2019-09-12 ENCOUNTER — Encounter: Payer: Self-pay | Admitting: Gastroenterology

## 2019-09-12 DIAGNOSIS — K635 Polyp of colon: Secondary | ICD-10-CM | POA: Insufficient documentation

## 2019-09-12 DIAGNOSIS — K219 Gastro-esophageal reflux disease without esophagitis: Secondary | ICD-10-CM | POA: Insufficient documentation

## 2019-09-12 DIAGNOSIS — E785 Hyperlipidemia, unspecified: Secondary | ICD-10-CM | POA: Diagnosis not present

## 2019-09-12 DIAGNOSIS — Z79899 Other long term (current) drug therapy: Secondary | ICD-10-CM | POA: Insufficient documentation

## 2019-09-12 DIAGNOSIS — Z1211 Encounter for screening for malignant neoplasm of colon: Secondary | ICD-10-CM

## 2019-09-12 DIAGNOSIS — K5730 Diverticulosis of large intestine without perforation or abscess without bleeding: Secondary | ICD-10-CM | POA: Diagnosis not present

## 2019-09-12 DIAGNOSIS — Z87891 Personal history of nicotine dependence: Secondary | ICD-10-CM | POA: Insufficient documentation

## 2019-09-12 DIAGNOSIS — D126 Benign neoplasm of colon, unspecified: Secondary | ICD-10-CM | POA: Diagnosis not present

## 2019-09-12 HISTORY — PX: COLONOSCOPY WITH PROPOFOL: SHX5780

## 2019-09-12 HISTORY — DX: Gastro-esophageal reflux disease without esophagitis: K21.9

## 2019-09-12 HISTORY — DX: Hyperlipidemia, unspecified: E78.5

## 2019-09-12 SURGERY — COLONOSCOPY WITH PROPOFOL
Anesthesia: General

## 2019-09-12 MED ORDER — LIDOCAINE HCL (PF) 1 % IJ SOLN
INTRAMUSCULAR | Status: AC
  Filled 2019-09-12: qty 2

## 2019-09-12 MED ORDER — PROPOFOL 500 MG/50ML IV EMUL
INTRAVENOUS | Status: AC
  Filled 2019-09-12: qty 50

## 2019-09-12 MED ORDER — LIDOCAINE HCL (CARDIAC) PF 100 MG/5ML IV SOSY
PREFILLED_SYRINGE | INTRAVENOUS | Status: DC | PRN
  Administered 2019-09-12: 30 mg via INTRAVENOUS

## 2019-09-12 MED ORDER — SODIUM CHLORIDE 0.9 % IV SOLN
INTRAVENOUS | Status: DC
  Administered 2019-09-12: 1000 mL via INTRAVENOUS

## 2019-09-12 MED ORDER — PROPOFOL 500 MG/50ML IV EMUL
INTRAVENOUS | Status: DC | PRN
  Administered 2019-09-12: 125 ug/kg/min via INTRAVENOUS

## 2019-09-12 MED ORDER — PROPOFOL 10 MG/ML IV BOLUS
INTRAVENOUS | Status: DC | PRN
  Administered 2019-09-12: 75 mg via INTRAVENOUS

## 2019-09-12 NOTE — Anesthesia Postprocedure Evaluation (Signed)
Anesthesia Post Note  Patient: Todd Castaneda  Procedure(s) Performed: COLONOSCOPY WITH PROPOFOL (N/A )  Patient location during evaluation: Endoscopy Anesthesia Type: General Level of consciousness: awake and alert Pain management: pain level controlled Vital Signs Assessment: post-procedure vital signs reviewed and stable Respiratory status: spontaneous breathing, nonlabored ventilation, respiratory function stable and patient connected to nasal cannula oxygen Cardiovascular status: blood pressure returned to baseline and stable Postop Assessment: no apparent nausea or vomiting Anesthetic complications: no   No complications documented.   Last Vitals:  Vitals:   09/12/19 1156 09/12/19 1206  BP: 120/84 (!) 136/90  Pulse: 68 64  Resp: 19 21  Temp:    SpO2: 91% 100%    Last Pain:  Vitals:   09/12/19 1206  TempSrc:   PainSc: 0-No pain                 Martha Clan

## 2019-09-12 NOTE — Transfer of Care (Signed)
Immediate Anesthesia Transfer of Care Note  Patient: Todd Castaneda  Procedure(s) Performed: COLONOSCOPY WITH PROPOFOL (N/A )  Patient Location: PACU and Endoscopy Unit  Anesthesia Type:General  Level of Consciousness: awake  Airway & Oxygen Therapy: Patient Spontanous Breathing  Post-op Assessment: Report given to RN  Post vital signs: stable  Last Vitals:  Vitals Value Taken Time  BP    Temp    Pulse    Resp    SpO2      Last Pain:  Vitals:   09/12/19 0945  TempSrc: Tympanic         Complications: No complications documented.

## 2019-09-12 NOTE — Anesthesia Preprocedure Evaluation (Addendum)
Anesthesia Evaluation  Patient identified by MRN, date of birth, ID band Patient awake    Reviewed: Allergy & Precautions, H&P , NPO status , Patient's Chart, lab work & pertinent test results, reviewed documented beta blocker date and time   History of Anesthesia Complications Negative for: history of anesthetic complications  Airway Mallampati: I  TM Distance: >3 FB Neck ROM: full    Dental  (+) Dental Advidsory Given, Missing, Chipped, Poor Dentition, Loose   Pulmonary neg shortness of breath, sleep apnea and Continuous Positive Airway Pressure Ventilation , neg COPD, neg recent URI, former smoker,    Pulmonary exam normal breath sounds clear to auscultation       Cardiovascular Exercise Tolerance: Good negative cardio ROS Normal cardiovascular exam Rhythm:regular Rate:Normal     Neuro/Psych negative neurological ROS  negative psych ROS   GI/Hepatic Neg liver ROS, GERD  ,  Endo/Other  negative endocrine ROS  Renal/GU negative Renal ROS  negative genitourinary   Musculoskeletal   Abdominal   Peds  Hematology negative hematology ROS (+)   Anesthesia Other Findings Past Medical History: No date: GERD (gastroesophageal reflux disease) No date: Hyperlipemia   Reproductive/Obstetrics negative OB ROS                            Anesthesia Physical Anesthesia Plan  ASA: II  Anesthesia Plan: General   Post-op Pain Management:    Induction: Intravenous  PONV Risk Score and Plan: 2 and Propofol infusion and TIVA  Airway Management Planned: Natural Airway and Nasal Cannula  Additional Equipment:   Intra-op Plan:   Post-operative Plan:   Informed Consent: I have reviewed the patients History and Physical, chart, labs and discussed the procedure including the risks, benefits and alternatives for the proposed anesthesia with the patient or authorized representative who has indicated  his/her understanding and acceptance.     Dental Advisory Given  Plan Discussed with: Anesthesiologist, CRNA and Surgeon  Anesthesia Plan Comments:         Anesthesia Quick Evaluation

## 2019-09-12 NOTE — Op Note (Signed)
[facility] Gastroenterology Patient Name: Todd Castaneda Procedure Date: 09/12/2019 11:03 AM MRN: [id number] Account #: [id number] Date of Birth: 1965-11-02 Admit Type: Inpatient Age: 54 Room: [facility] ENDO ROOM 2 Gender: Male Note Status: Finalized Procedure:             Colonoscopy Indications:           Screening for colorectal malignant neoplasm Providers:             Jonathon Bellows MD, MD Referring MD:          Revonda Standard. Roxan Hockey MD, MD (Referring MD) Medicines:             Monitored Anesthesia Care Complications:         No immediate complications. Procedure:             Pre-Anesthesia Assessment:                        - Prior to the procedure, a History and Physical was                         performed, and patient medications, allergies and                         sensitivities were reviewed. The patient's tolerance                         of previous anesthesia was reviewed.                        - The risks and benefits of the procedure and the                         sedation options and risks were discussed with the                         patient. All questions were answered and informed                         consent was obtained.                        - ASA Grade Assessment: II - A patient with mild                         systemic disease.                        After obtaining informed consent, the colonoscope was                         passed under direct vision. Throughout the procedure,                         the patient's blood pressure, pulse, and oxygen                         saturations were monitored continuously. The                         Colonoscope was introduced through the  anus and                         advanced to the the cecum, identified by the                         appendiceal orifice. The colonoscopy was performed                         without difficulty. The patient tolerated the                         procedure  well. The quality of the bowel preparation                         was excellent. Findings:      The perianal and digital rectal examinations were normal.      Two sessile polyps were found in the proximal ascending colon and cecum.       The polyps were 6 to 9 mm in size. These polyps were removed with a cold       snare. Resection and retrieval were complete.      Multiple small-mouthed diverticula were found in the sigmoid colon.      The exam was otherwise without abnormality on direct and retroflexion       views. Impression:            - Two 6 to 9 mm polyps in the proximal ascending colon                         and in the cecum, removed with a cold snare. Resected                         and retrieved.                        - Diverticulosis in the sigmoid colon.                        - The examination was otherwise normal on direct and                         retroflexion views. Recommendation:        - Discharge patient to home (with escort).                        - Resume previous diet.                        - Continue present medications.                        - Await pathology results.                        - Repeat colonoscopy for surveillance based on                         pathology results. Procedure Code(s):     --- Professional ---  45385, Colonoscopy, flexible; with removal of                         tumor(s), polyp(s), or other lesion(s) by snare                         technique Diagnosis Code(s):     --- Professional ---                        Z12.11, Encounter for screening for malignant neoplasm                         of colon                        K63.5, Polyp of colon                        K57.30, Diverticulosis of large intestine without                         perforation or abscess without bleeding CPT copyright 2019 American Medical Association. All rights reserved. The codes documented in this report are preliminary and  upon coder review may  be revised to meet current compliance requirements. Jonathon Bellows, MD Jonathon Bellows MD, MD 09/12/2019 11:42:27 AM This report has been signed electronically. Number of Addenda: 0 Note Initiated On: 09/12/2019 11:03 AM Scope Withdrawal Time: 0 hours 20 minutes 52 seconds  Total Procedure Duration: 0 hours 22 minutes 35 seconds  Estimated Blood Loss:  Estimated blood loss: none.      [facility] [address]

## 2019-09-12 NOTE — H&P (Signed)
Jonathon Bellows, MD [address], [place], [place], [place], [address] [id number], [place], [place], [place], [address] Phone: [phone]  Fax: [phone]  Primary Care Physician:  Towanda Malkin, MD   Pre-Procedure History & Physical: HPI:  Todd Castaneda is a 54 y.o. male is here for an colonoscopy.   Past Medical History:  Diagnosis Date  . GERD (gastroesophageal reflux disease)   . Hyperlipemia     History reviewed. No pertinent surgical history.  Prior to Admission medications   Medication Sig Start Date End Date Taking? Authorizing Provider  atorvastatin (LIPITOR) 10 MG tablet Take 1 tablet (10 mg total) by mouth daily. 07/16/19   Towanda Malkin, MD  famotidine (PEPCID) 20 MG tablet Take 1 tablet (20 mg total) by mouth 2 (two) times daily. 04/02/18   Caryn Section Linden Dolin, PA-C  sildenafil (REVATIO) 20 MG tablet Take 1-3 tabs 0.5-2 hours prior to planned sexual activity 06/18/19   Towanda Malkin, MD    Allergies as of 07/30/2019 - Review Complete 07/30/2019  Allergen Reaction Noted  . Augmentin [amoxicillin-pot clavulanate] Hives 04/02/2018    Family History  Problem Relation Age of Onset  . Diabetes Mother     Social History   Socioeconomic History  . Marital status: Legally Separated    Spouse name: Not on file  . Number of children: 2  . Years of education: Not on file  . Highest education level: GED or equivalent  Occupational History  . Not on file  Tobacco Use  . Smoking status: Former Research scientist (life sciences)  . Smokeless tobacco: Never Used  Vaping Use  . Vaping Use: Never used  Substance and Sexual Activity  . Alcohol use: Never  . Drug use: Never  . Sexual activity: Yes  Other Topics Concern  . Not on file  Social History Narrative  . Not on file   Social Determinants of Health   Financial Resource Strain:   . Difficulty of Paying Living Expenses:   Food Insecurity:   . Worried About Charity fundraiser in the Last Year:     . Arboriculturist in the Last Year:   Transportation Needs:   . Film/video editor (Medical):   Marland Kitchen Lack of Transportation (Non-Medical):   Physical Activity:   . Days of Exercise per Week:   . Minutes of Exercise per Session:   Stress:   . Feeling of Stress :   Social Connections:   . Frequency of Communication with Friends and Family:   . Frequency of Social Gatherings with Friends and Family:   . Attends Religious Services:   . Active Member of Clubs or Organizations:   . Attends Archivist Meetings:   Marland Kitchen Marital Status:   Intimate Partner Violence:   . Fear of Current or Ex-Partner:   . Emotionally Abused:   Marland Kitchen Physically Abused:   . Sexually Abused:     Review of Systems: See HPI, otherwise negative ROS  Physical Exam: BP (!) 125/88   Pulse 68   Temp (!) 97.1 F (36.2 C) (Tympanic)   Resp 17   Ht 5\' 7"  (1.702 m)   Wt (!) 96.2 kg   SpO2 98%   BMI 33.20 kg/m  General:   Alert,  pleasant and cooperative in NAD Head:  Normocephalic and atraumatic. Neck:  Supple; no masses or thyromegaly. Lungs:  Clear throughout to auscultation, normal respiratory effort.    Heart:  +S1, +S2, Regular  rate and rhythm, No edema. Abdomen:  Soft, nontender and nondistended. Normal bowel sounds, without guarding, and without rebound.   Neurologic:  Alert and  oriented x4;  grossly normal neurologically.  Impression/Plan: Todd Castaneda is here for an colonoscopy to be performed for Screening colonoscopy average risk   Risks, benefits, limitations, and alternatives regarding  colonoscopy have been reviewed with the patient.  Questions have been answered.  All parties agreeable.   Jonathon Bellows, MD  09/12/2019, 11:04 AM

## 2019-09-15 ENCOUNTER — Encounter: Payer: Self-pay | Admitting: Gastroenterology

## 2019-09-15 LAB — SURGICAL PATHOLOGY

## 2019-11-18 DIAGNOSIS — K635 Polyp of colon: Secondary | ICD-10-CM | POA: Insufficient documentation

## 2019-11-18 NOTE — Progress Notes (Signed)
Patient ID: Todd Castaneda, male    DOB: [birth year], 54 y.o.   MRN: [id number]  PCP: Towanda Malkin, MD  Chief Complaint  Patient presents with  . Follow-up    Subjective:   Todd Castaneda is a 54 y.o. male, presents to clinic with CC of the following:  Chief Complaint  Patient presents with  . Follow-up    HPI:  Patient is a 54 year old male who established with this practice on 06/18/2019, last visit 07/16/19 His blood pressure was borderline high in recent past Also his glucose was slightly elevated with glucose noted in the urine on initial visit.  In addition his lipids were abnormal. He follows up today. All in all, notes things are going well.  Obesity Weight has remained stable, and if anything has lost some weight since our last visit, still noted he desires to lose weight moving forward. Also some form of regular physical activity important and again emphasized today, and has not yet increased his exercise habits outside of being very active at work [employer] Readings from Last 3 Encounters:  11/19/19 205 lb 4.8 oz (93.1 kg)  09/12/19 (!) 212 lb (96.2 kg)  07/16/19 213 lb 3.2 oz (96.7 kg)     BP elevation No h/o HTNin his past Med regimen - none, discussed possible need for this addition on a f/u pending status Has not checked his blood pressure on the outside   BP Readings from Last 3 Encounters:  11/19/19 120/84  09/12/19 (!) 136/90  07/16/19 132/82   No CP, palp, SOB, LE swelling,no increased headaches, vision changes   GERD Continues taking OTC Pepcid, often takes one in am and helps all day. Bad heartburnoftenif not take one. Tries to avoid spicy foodsto help. Been last few years at leastthat he has had the symptoms intermittently, and continues using the Pepcid product which is helpful.  No dark or black stools, no BRBPR, no chronic abd pains,   Hyperlipidemia - Med regimen - Lipitor - 10 mg daily Taking med regularly, but  recently ran out and gets paid tomorrow and will pick up a refill       Lab Results  Component Value Date   CHOL 231 (H) 06/18/2019   HDL 49 06/18/2019   LDLCALC 163 (H) 06/18/2019   TRIG 88 06/18/2019   CHOLHDL 4.7 06/18/2019   No myalgia  Hyperglycemia/glucosuria/prediabetes Med regimen - none      Lab Results   Lab Results  Component Value Date   HGBA1C 5.5 07/16/2019   Lab Results  Component Value Date   LDLCALC 163 (H) 06/18/2019   CREATININE 0.92 06/18/2019    Denies any history of diabetes, no recent increased thirst, increased urination, numbness or tingling in the extremities + FH noted, also noted his GF on insulin for DM  ED Notes he is able to get erections, often do not last.  Medication regimen -sildenafil Notes is helpful, taking three presently when uses  Prostate cancer screening Patient has followed PSAs in the past for this, discussed role, and will continue to follow with most recent check good Does not get up overnight to urinate, deniesmarkedhesitancy, frequency, urgency symptoms. Notes still has good urine flow. No family history of prostate cancer      Lab Results  Component Value Date   PSA 1.0 06/18/2019    Colon cancer screening Had colonoscopy 09/12/19    Impression: - Two 6 to 9 mm polyps in the proximal  ascending colon                         and in the cecum, removed with a cold snare. Resected                         and retrieved.                        - Diverticulosis in the sigmoid colon.                        - The examination was otherwise normal on direct and                         retroflexion views.  A. COLON POLYP, CECUM; COLD SNARE:  - SESSILE SERRATED POLYP.  - NEGATIVE FOR DYSPLASIA AND MALIGNANCY.   B. COLON POLYP, ASCENDING; COLD SNARE:  - SESSILE SERRATED POLYP WITH FOCAL CONVENTIONAL DYSPLASIA.  - NEGATIVE FOR MALIGNANCY.  No family history of colon cancer Denies any recent black stools,  dark stools, bleeding per rectum, no chronic abdominal pains, no chronic constipation or diarrhea.   Diet-no change since last visit, again encouraged changes to eat healthy  Exercise-a lot at work, does [employer], none outside of work, wants to start walking still but hasn't started. Tobacco-former smoker Alcohol-denied use He noted he lives with his son, is monogamous with his current partner,    Patient Active Problem List   Diagnosis Date Noted  . Polyp of colon 11/18/2019  . Prediabetes 07/16/2019  . Mixed hyperlipidemia 07/15/2019  . Class 1 obesity due to excess calories with serious comorbidity and body mass index (BMI) of 33.0 to 33.9 in adult 07/15/2019  . Elevated BP without diagnosis of hypertension 06/18/2019  . Erectile dysfunction 06/18/2019  . Gastroesophageal reflux disease without esophagitis 06/18/2019      Current Outpatient Medications:  .  atorvastatin (LIPITOR) 10 MG tablet, Take 1 tablet (10 mg total) by mouth daily., Disp: 90 tablet, Rfl: 3 .  famotidine (PEPCID) 20 MG tablet, Take 1 tablet (20 mg total) by mouth 2 (two) times daily., Disp: 14 tablet, Rfl: 0 .  sildenafil (REVATIO) 20 MG tablet, Take 1-3 tabs 0.5-2 hours prior to planned sexual activity, Disp: 30 tablet, Rfl: 5   Allergies  Allergen Reactions  . Augmentin [Amoxicillin-Pot Clavulanate] Hives     Past Surgical History:  Procedure Laterality Date  . COLONOSCOPY WITH PROPOFOL N/A 09/12/2019   Procedure: COLONOSCOPY WITH PROPOFOL;  Surgeon: Jonathon Bellows, MD;  [facility] END[facility]COPY;  Service: Gastroenterology;  Laterality: N/A;     Family History  Problem Relation Age of Onset  . Diabetes Mother      Social History   Tobacco Use  . Smoking status: Former Research scientist (life sciences)  . Smokeless tobacco: Never Used  Substance Use Topics  . Alcohol use: Never    With staff assistance, above reviewed with the patient today.  ROS: As per HPI, otherwise no specific complaints on a  limited and focused system review   No results found for this or any previous visit (from the past 72 hour(s)).   PHQ2/9: Depression screen [facility] 2/9 11/19/2019 07/16/2019 06/18/2019  Decreased Interest 0 0 0  Down, Depressed, Hopeless 0 0 0  PHQ - 2 Score 0 0 0  Altered sleeping - 0 0  Tired, decreased  energy - 0 1  Change in appetite - 0 0  Feeling bad or failure about yourself  - 0 0  Trouble concentrating - 0 0  Moving slowly or fidgety/restless - 0 0  Suicidal thoughts - 0 0  PHQ-9 Score - 0 1  Difficult doing work/chores - Not difficult at all Not difficult at all   PHQ-2/9 Result is   Fall Risk: Fall Risk  11/19/2019 07/16/2019 06/18/2019  Falls in the past year? 0 0 0  Number falls in past yr: 0 0 0  Injury with Fall? 0 0 0  Follow up Falls evaluation completed - -      Objective:   Vitals:   11/19/19 0739  BP: 120/84  Pulse: 74  Resp: 16  Temp: 98.2 F (36.8 C)  TempSrc: Oral  SpO2: 100%  Weight: 205 lb 4.8 oz (93.1 kg)  Height: 5\' 7"  (1.702 m)    Body mass index is 32.15 kg/m.  Physical Exam   NAD, masked, pleasant HEENT - [place]/AT, sclera anicteric, PERRL, EOMI, conj - non-inj'ed,  pharynx clear, poor dentition Neck - supple, no adenopathy, carotids 2+ and = without bruits bilat Car - RRR without m/g/r Pulm- RR and effort normal at rest, CTA without wheeze or rales Abd - soft, NT, mildly obese,  Back - no CVA tenderness Ext - no LE edema,  Neuro/psychiatric - affect was not flat, appropriate with conversation             Alert and oriented             Grossly non-focal, with good strength in extremities, sensation intact to light touch in the extremities             Speech normal   Results for orders placed or performed during the hospital encounter of 09/12/19  Surgical pathology  Result Value Ref Range   SURGICAL PATHOLOGY      SURGICAL PATHOLOGY CASE: [id number] PATIENT: Barrington Ellison Surgical Pathology Report     Specimen Submitted: A.  Colon polyp, cecum; cold snare B. Colon polyp, ascending; cold snare  Clinical History: Screening colonoscopy.  Cecum and ascending colon polyps      DIAGNOSIS: A. COLON POLYP, CECUM; COLD SNARE: - SESSILE SERRATED POLYP. - NEGATIVE FOR DYSPLASIA AND MALIGNANCY.  B. COLON POLYP, ASCENDING; COLD SNARE: - SESSILE SERRATED POLYP WITH FOCAL CONVENTIONAL DYSPLASIA. - NEGATIVE FOR MALIGNANCY.   GROSS DESCRIPTION: A. Labeled: Cecum polyp cold snare Received: Formalin Tissue fragment(s): Multiple Size: Aggregate, 1.4 x 0.4 x 0.3 cm Description: Tan soft tissue fragments Entirely submitted in 1 cassette.  B. Labeled: Ascending colon polyp cold snare Received: Formalin Tissue fragment(s): Multiple Size: Aggregate, 1.8 x 0.8 x 0.2 cm Description: Tan soft tissue fragments with admixed fecal matter Entirely submitted in 1 cassette.    Final  Diagnosis performed by Betsy Pries, MD.   Electronically signed 09/15/2019 9:49:44AM The electronic signature indicates that the named Attending Pathologist has evaluated the specimen Technical component performed at [facility], [address], [place], [place] [address] Lab: [phone] Dir: Rush Farmer, MD, MMM  Professional component performed at [facility], [facility], [place], [place], [place] [address] Lab: [phone] Dir: Dellia Nims. Rubinas, MD    Last labs reviewed Hemoglobin A1c checked in the office today was 5.6    Assessment & Plan:    1. Mixed hyperlipidemia Continue Lipitor product Reviewed the last labs with him today with the elevated LDL concern noted. Also continue with dietary  modifications and efforts to increase physical activity, with information provided in the AVS to help as well.  2. Hyperglycemia Felt best to check an A1c today, and was done in the office. No evidence of diabetes presently. Continue to monitor. - POCT glycosylated hemoglobin (Hb A1C)  3. Prediabetes As above  noted - POCT glycosylated hemoglobin (Hb A1C)  4. Elevated BP without diagnosis of hypertension Pressure today good, and has not had outside checks. He notes his girlfriend has a monitor, and can get some outside checks in the future, and recommended he do so. If his blood pressure is more elevated on outside checks, should follow-up sooner than the planned follow-up.  5. Class 1 obesity due to excess calories with serious comorbidity and body mass index (BMI) of 32.0 to 32.9 in adult Has been doing well with weight maintenance in the recent past, and encouraged this to continue.  6. Polyp of colon, unspecified part of colon, unspecified type Did have recent colonoscopy, with the polyps as noted above. Was told to have a follow-up again in about 5 years.  7. Gastroesophageal reflux disease without esophagitis Remains on the Pepcid product and does well with that.  8. Erectile dysfunction, unspecified erectile dysfunction type Uses sildenafil, not take 3 tablets prior to planned activities, and has been helpful. Can continue.   Tentatively, we will schedule follow-up in approximately 6 months time, follow-up sooner as needed.    Towanda Malkin, MD 11/19/19 7:48 AM

## 2019-11-19 ENCOUNTER — Ambulatory Visit: Payer: 59 | Admitting: Internal Medicine

## 2019-11-19 ENCOUNTER — Encounter: Payer: Self-pay | Admitting: Internal Medicine

## 2019-11-19 ENCOUNTER — Other Ambulatory Visit: Payer: Self-pay

## 2019-11-19 VITALS — BP 120/84 | HR 74 | Temp 98.2°F | Resp 16 | Ht 67.0 in | Wt 205.3 lb

## 2019-11-19 DIAGNOSIS — R739 Hyperglycemia, unspecified: Secondary | ICD-10-CM | POA: Diagnosis not present

## 2019-11-19 DIAGNOSIS — K219 Gastro-esophageal reflux disease without esophagitis: Secondary | ICD-10-CM

## 2019-11-19 DIAGNOSIS — K635 Polyp of colon: Secondary | ICD-10-CM

## 2019-11-19 DIAGNOSIS — R7303 Prediabetes: Secondary | ICD-10-CM

## 2019-11-19 DIAGNOSIS — R030 Elevated blood-pressure reading, without diagnosis of hypertension: Secondary | ICD-10-CM | POA: Diagnosis not present

## 2019-11-19 DIAGNOSIS — E782 Mixed hyperlipidemia: Secondary | ICD-10-CM | POA: Diagnosis not present

## 2019-11-19 DIAGNOSIS — N529 Male erectile dysfunction, unspecified: Secondary | ICD-10-CM

## 2019-11-19 DIAGNOSIS — E6609 Other obesity due to excess calories: Secondary | ICD-10-CM

## 2019-11-19 DIAGNOSIS — Z6832 Body mass index (BMI) 32.0-32.9, adult: Secondary | ICD-10-CM

## 2019-11-19 LAB — POCT GLYCOSYLATED HEMOGLOBIN (HGB A1C): Hemoglobin A1C: 5.6 % (ref 4.0–5.6)

## 2019-11-19 NOTE — Patient Instructions (Signed)

## 2020-05-18 ENCOUNTER — Encounter: Payer: Self-pay | Admitting: Family Medicine

## 2020-05-18 ENCOUNTER — Ambulatory Visit: Payer: 59 | Admitting: Internal Medicine

## 2020-05-18 ENCOUNTER — Other Ambulatory Visit: Payer: Self-pay

## 2020-05-18 ENCOUNTER — Ambulatory Visit: Payer: 59 | Admitting: Family Medicine

## 2020-05-18 VITALS — BP 130/78 | HR 81 | Temp 98.0°F | Resp 16 | Ht 67.0 in | Wt 219.0 lb

## 2020-05-18 DIAGNOSIS — K219 Gastro-esophageal reflux disease without esophagitis: Secondary | ICD-10-CM

## 2020-05-18 DIAGNOSIS — G4733 Obstructive sleep apnea (adult) (pediatric): Secondary | ICD-10-CM

## 2020-05-18 DIAGNOSIS — E782 Mixed hyperlipidemia: Secondary | ICD-10-CM | POA: Diagnosis not present

## 2020-05-18 DIAGNOSIS — Z5181 Encounter for therapeutic drug level monitoring: Secondary | ICD-10-CM | POA: Diagnosis not present

## 2020-05-18 DIAGNOSIS — R7303 Prediabetes: Secondary | ICD-10-CM

## 2020-05-18 DIAGNOSIS — D75839 Thrombocytosis, unspecified: Secondary | ICD-10-CM

## 2020-05-18 DIAGNOSIS — N529 Male erectile dysfunction, unspecified: Secondary | ICD-10-CM

## 2020-05-18 DIAGNOSIS — Z9989 Dependence on other enabling machines and devices: Secondary | ICD-10-CM

## 2020-05-18 MED ORDER — SILDENAFIL CITRATE 20 MG PO TABS: ORAL_TABLET | ORAL | 5 refills | Status: DC

## 2020-05-18 MED ORDER — FAMOTIDINE 20 MG PO TABS: 20.0000 mg | ORAL_TABLET | Freq: Two times a day (BID) | ORAL | 0 refills | Status: DC

## 2020-05-18 NOTE — Progress Notes (Signed)
Name: Todd Castaneda   MRN: [id number]    DOB: 1965/03/13   Date:05/18/2020       Progress Note  Chief Complaint  Patient presents with  . Follow-up  . Hyperlipidemia     Subjective:   Todd Castaneda is a 55 y.o. male, presents to clinic for routine f/up  Note of elevated BP in the past w/o Dx of HTN and not on meds  BP Readings from Last 3 Encounters:  05/18/20 130/78  11/19/19 120/84  09/12/19 (!) 136/90    Hyperlipidemia: Currently treated with Lipitor 10 mg, pt reports good med compliance Last Lipids: Lab Results  Component Value Date   CHOL 231 (H) 06/18/2019   HDL 49 06/18/2019   LDLCALC 163 (H) 06/18/2019   TRIG 88 06/18/2019   CHOLHDL 4.7 06/18/2019   - Denies: Chest pain, shortness of breath, myalgias, claudication    Current Outpatient Medications:  .  atorvastatin (LIPITOR) 10 MG tablet, Take 1 tablet (10 mg total) by mouth daily., Disp: 90 tablet, Rfl: 3 .  famotidine (PEPCID) 20 MG tablet, Take 1 tablet (20 mg total) by mouth 2 (two) times daily., Disp: 14 tablet, Rfl: 0 .  sildenafil (REVATIO) 20 MG tablet, Take 1-3 tabs 0.5-2 hours prior to planned sexual activity, Disp: 30 tablet, Rfl: 5  Patient Active Problem List   Diagnosis Date Noted  . Polyp of colon 11/18/2019  . Prediabetes 07/16/2019  . Hyperglycemia 07/15/2019  . Mixed hyperlipidemia 07/15/2019  . Class 1 obesity due to excess calories with serious comorbidity and body mass index (BMI) of 32.0 to 32.9 in adult 07/15/2019  . Elevated BP without diagnosis of hypertension 06/18/2019  . Erectile dysfunction 06/18/2019  . Gastroesophageal reflux disease without esophagitis 06/18/2019    Past Surgical History:  Procedure Laterality Date  . COLONOSCOPY WITH PROPOFOL N/A 09/12/2019   Procedure: COLONOSCOPY WITH PROPOFOL;  Surgeon: Jonathon Bellows, MD;  Location: [facility] ENDOSCOPY;  Service: Gastroenterology;  Laterality: N/A;    Family History  Problem Relation Age of Onset  . Diabetes Mother      Social History   Tobacco Use  . Smoking status: Former Research scientist (life sciences)  . Smokeless tobacco: Never Used  Vaping Use  . Vaping Use: Never used  Substance Use Topics  . Alcohol use: Never  . Drug use: Never     Allergies  Allergen Reactions  . Augmentin [Amoxicillin-Pot [place] Maintenance  Topic Date Due  . COVID-19 Vaccine (1) Never done  . INFLUENZA VACCINE  05/20/2020 (Originally 09/21/2019)  . COLONOSCOPY (Pts 45-46yrs Insurance coverage will need to be confirmed)  09/11/2024  . TETANUS/TDAP  06/17/2029  . Hepatitis C Screening  Completed  . HIV Screening  Completed  . HPV VACCINES  Aged Out    Chart Review Today: I personally reviewed active problem list, medication list, allergies, family history, social history, health maintenance, notes from last encounter, lab results, imaging with the patient/caregiver today.   Review of Systems  Constitutional: Negative.   HENT: Negative.   Eyes: Negative.   Respiratory: Negative.   Cardiovascular: Negative.   Gastrointestinal: Negative.   Endocrine: Negative.   Genitourinary: Negative.   Musculoskeletal: Negative.   Skin: Negative.   Allergic/Immunologic: Negative.   Neurological: Negative.   Hematological: Negative.   Psychiatric/Behavioral: Negative.   All other systems reviewed and are negative.    Objective:   Vitals:   05/18/20 1458  BP: 130/78  Pulse: 81  Resp: 16  Temp: 98 F (36.7 C)  SpO2: 98%  Weight: 219 lb (99.3 kg)  Height: 5\' 7"  (1.702 m)    Body mass index is 34.3 kg/m.  Physical Exam Vitals and nursing note reviewed.  Constitutional:      General: He is not in acute distress.    Appearance: Normal appearance. He is well-developed. He is obese. He is not ill-appearing, toxic-appearing or diaphoretic.     Interventions: Face mask in place.  HENT:     Head: Normocephalic and atraumatic.     Jaw: No trismus.     Right Ear: External ear normal.     Left Ear: External  ear normal.  Eyes:     General: Lids are normal. No scleral icterus.       Right eye: No discharge.        Left eye: No discharge.     Conjunctiva/sclera: Conjunctivae normal.  Neck:     Trachea: Trachea and phonation normal. No tracheal deviation.  Cardiovascular:     Rate and Rhythm: Normal rate and regular rhythm.     Pulses: Normal pulses.          Radial pulses are 2+ on the right side and 2+ on the left side.       Posterior tibial pulses are 2+ on the right side and 2+ on the left side.     Heart sounds: Normal heart sounds. No murmur heard. No friction rub. No gallop.   Pulmonary:     Effort: Pulmonary effort is normal. No respiratory distress.     Breath sounds: Normal breath sounds. No stridor. No wheezing, rhonchi or rales.  Abdominal:     General: Bowel sounds are normal. There is no distension.     Palpations: Abdomen is soft.  Musculoskeletal:     Right lower leg: No edema.     Left lower leg: No edema.  Skin:    General: Skin is warm and dry.     Coloration: Skin is not jaundiced.     Findings: No rash.     Nails: There is no clubbing.  Neurological:     Mental Status: He is alert. Mental status is at baseline.     Cranial Nerves: No dysarthria or facial asymmetry.     Motor: No tremor or abnormal muscle tone.     Gait: Gait normal.  Psychiatric:        Mood and Affect: Mood normal.        Speech: Speech normal.        Behavior: Behavior normal. Behavior is cooperative.         Assessment & Plan:   1. Mixed hyperlipidemia Compliant with meds, no SE, no myalgias, fatigue or jaundice Due for FLP and recheck CMP- statin new -discussed dosing and possible need to increase if he did not have a good reduction in LDL Diet and exercise recommendations for HLD reviewed - Lipid panel - COMPLETE METABOLIC PANEL WITH GFR  2. Medication monitoring encounter - Lipid panel - COMPLETE METABOLIC PANEL WITH GFR - CBC with Differential/Platelet  3. Prediabetes -  COMPLETE METABOLIC PANEL WITH GFR  4. Gastroesophageal reflux disease without esophagitis Currently symptoms are well controlled Refill on Pepcid sent in today  5. Erectile dysfunction, unspecified erectile dysfunction type Working well no side effects concerns - sildenafil (REVATIO) 20 MG tablet; Take 1-3 tabs 0.5-2 hours prior to planned sexual activity  Dispense: 30 tablet; Refill: 5  6. Thrombocytosis Mildly elevated platelets with  his last labs will recheck today - CBC with Differential/Platelet  7. OSA on CPAP He notes having a sleep study and CPAP management over 15 years ago and he recently reached out to the manufacturer of his CPAP however they state he needs new orders and he has not been followed up on this for a long time - Ambulatory referral to Pulmonology   Return in about 6 months (around 11/18/2020) for Routine follow-up.   Delsa Grana, PA-C 05/18/20 3:33 PM

## 2020-05-19 LAB — CBC WITH DIFFERENTIAL/PLATELET
Absolute Monocytes: 745 cells/uL (ref 200–950)
Basophils Absolute: 58 cells/uL (ref 0–200)
Basophils Relative: 0.8 %
Eosinophils Absolute: 146 cells/uL (ref 15–500)
Eosinophils Relative: 2 %
HCT: 42 % (ref 38.5–50.0)
Hemoglobin: 14.5 g/dL (ref 13.2–17.1)
Lymphs Abs: 1664 cells/uL (ref 850–3900)
MCH: 31.5 pg (ref 27.0–33.0)
MCHC: 34.5 g/dL (ref 32.0–36.0)
MCV: 91.3 fL (ref 80.0–100.0)
MPV: 9.8 fL (ref 7.5–12.5)
Monocytes Relative: 10.2 %
Neutro Abs: 4687 cells/uL (ref 1500–7800)
Neutrophils Relative %: 64.2 %
Platelets: 368 10*3/uL (ref 140–400)
RBC: 4.6 10*6/uL (ref 4.20–5.80)
RDW: 12 % (ref 11.0–15.0)
Total Lymphocyte: 22.8 %
WBC: 7.3 10*3/uL (ref 3.8–10.8)

## 2020-05-19 LAB — COMPLETE METABOLIC PANEL WITH GFR
AG Ratio: 1.8 (calc) (ref 1.0–2.5)
ALT: 21 U/L (ref 9–46)
AST: 16 U/L (ref 10–35)
Albumin: 4.2 g/dL (ref 3.6–5.1)
Alkaline phosphatase (APISO): 51 U/L (ref 35–144)
BUN: 19 mg/dL (ref 7–25)
CO2: 28 mmol/L (ref 20–32)
Calcium: 9.2 mg/dL (ref 8.6–10.3)
Chloride: 103 mmol/L (ref 98–110)
Creat: 0.77 mg/dL (ref 0.70–1.33)
GFR, Est African American: 120 mL/min/{1.73_m2} (ref >=60)
GFR, Est Non African American: 104 mL/min/{1.73_m2} (ref >=60)
Globulin: 2.3 g/dL (calc) (ref 1.9–3.7)
Glucose, Bld: 104 mg/dL — ABNORMAL HIGH (ref 65–99)
Potassium: 4.3 mmol/L (ref 3.5–5.3)
Sodium: 139 mmol/L (ref 135–146)
Total Bilirubin: 0.4 mg/dL (ref 0.2–1.2)
Total Protein: 6.5 g/dL (ref 6.1–8.1)

## 2020-05-19 LAB — LIPID PANEL
Cholesterol: 199 mg/dL (ref <200)
HDL: 46 mg/dL (ref >=40)
LDL Cholesterol (Calc): 117 mg/dL (calc) — ABNORMAL HIGH
Non-HDL Cholesterol (Calc): 153 mg/dL (calc) — ABNORMAL HIGH (ref <130)
Total CHOL/HDL Ratio: 4.3 (calc) (ref <5.0)
Triglycerides: 240 mg/dL — ABNORMAL HIGH (ref <150)

## 2020-05-20 ENCOUNTER — Other Ambulatory Visit: Payer: Self-pay | Admitting: Family Medicine

## 2020-05-20 DIAGNOSIS — E782 Mixed hyperlipidemia: Secondary | ICD-10-CM

## 2020-05-20 MED ORDER — ATORVASTATIN CALCIUM 10 MG PO TABS: 10.0000 mg | ORAL_TABLET | Freq: Every day | ORAL | 3 refills | Status: DC

## 2020-06-07 ENCOUNTER — Other Ambulatory Visit: Payer: Self-pay

## 2020-06-07 ENCOUNTER — Ambulatory Visit: Payer: 59 | Admitting: Internal Medicine

## 2020-06-07 ENCOUNTER — Encounter: Payer: Self-pay | Admitting: Internal Medicine

## 2020-06-07 VITALS — BP 140/90 | HR 90 | Temp 98.3°F | Resp 16 | Ht 68.0 in | Wt 220.4 lb

## 2020-06-07 DIAGNOSIS — R062 Wheezing: Secondary | ICD-10-CM

## 2020-06-07 DIAGNOSIS — R0602 Shortness of breath: Secondary | ICD-10-CM

## 2020-06-07 DIAGNOSIS — G4733 Obstructive sleep apnea (adult) (pediatric): Secondary | ICD-10-CM | POA: Diagnosis not present

## 2020-06-07 NOTE — Patient Instructions (Signed)

## 2020-06-07 NOTE — Progress Notes (Signed)
[facility] [place], [place] [address]  Pulmonary Sleep Medicine   Office Visit Note  Patient Name: Todd Castaneda DOB: 11/23/1965 MRN [id number]  Date of Service: 06/07/2020  Complaints/HPI: He has OSA diagnosed many years ago. He has been on CPAP but his machine has quit on him. His current machine is about 76-54 years old. Patient states that he has been noting some fatigue and has had some tiredness. He does snore without the machine. He has been having some headaches in the morning. He denies any meemory problems. He is a former smoker for at least 30 years ago  ROS  General: (-) fever, (-) chills, (-) night sweats, (-) weakness Skin: (-) rashes, (-) itching,. Eyes: (-) visual changes, (-) redness, (-) itching. Nose and Sinuses: (-) nasal stuffiness or itchiness, (-) postnasal drip, (-) nosebleeds, (-) sinus trouble. Mouth and Throat: (-) sore throat, (-) hoarseness. Neck: (-) swollen glands, (-) enlarged thyroid, (-) neck pain. Respiratory: - cough, (-) bloody sputum, - shortness of breath, +wheezing. Cardiovascular: - ankle swelling, (-) chest pain. Lymphatic: (-) lymph node enlargement. Neurologic: (-) numbness, (-) tingling. Psychiatric: (-) anxiety, (-) depression   Current Medication: Outpatient Encounter Medications as of 06/07/2020  Medication Sig  . atorvastatin (LIPITOR) 10 MG tablet Take 1 tablet (10 mg total) by mouth daily.  . famotidine (PEPCID) 20 MG tablet Take 1 tablet (20 mg total) by mouth 2 (two) times daily.  . sildenafil (REVATIO) 20 MG tablet Take 1-3 tabs 0.5-2 hours prior to planned sexual activity   No facility-administered encounter medications on file as of 06/07/2020.    Surgical History: Past Surgical History:  Procedure Laterality Date  . COLONOSCOPY WITH PROPOFOL N/A 09/12/2019   Procedure: COLONOSCOPY WITH PROPOFOL;  Surgeon: Jonathon Bellows, MD;  Location: [facility] ENDOSCOPY;  Service: Gastroenterology;  Laterality:  N/A;    Medical History: Past Medical History:  Diagnosis Date  . GERD (gastroesophageal reflux disease)   . Hyperlipemia     Family History: Family History  Problem Relation Age of Onset  . Diabetes Mother     Social History: Social History   Socioeconomic History  . Marital status: Legally Separated    Spouse name: Not on file  . Number of children: 2  . Years of education: Not on file  . Highest education level: GED or equivalent  Occupational History  . Not on file  Tobacco Use  . Smoking status: Former Research scientist (life sciences)  . Smokeless tobacco: Never Used  Vaping Use  . Vaping Use: Never used  Substance and Sexual Activity  . Alcohol use: Never  . Drug use: Never  . Sexual activity: Yes  Other Topics Concern  . Not on file  Social History Narrative  . Not on file   Social Determinants of Health   Financial Resource Strain: Not on file  Food Insecurity: Not on file  Transportation Needs: Not on file  Physical Activity: Not on file  Stress: Not on file  Social Connections: Not on file  Intimate Partner Violence: Not on file    Vital Signs: Blood pressure 140/90, pulse 90, temperature 98.3 F (36.8 C), resp. rate 16, height 5\' 8"  (1.727 m), weight 220 lb 6.4 oz (100 kg), SpO2 97 %.  Examination: General Appearance: The patient is well-developed, well-nourished, and in no distress. Skin: Gross inspection of skin unremarkable. Head: normocephalic, no gross deformities. Eyes: no gross deformities noted. ENT: ears appear grossly normal no exudates. Neck: Supple. No thyromegaly. No LAD.  Respiratory: no rhonchi noted at this time. Cardiovascular: Normal S1 and S2 without murmur or rub. Extremities: No cyanosis. pulses are equal. Neurologic: Alert and oriented. No involuntary movements.  LABS: Recent Results (from the past 2160 hour(s))  Lipid panel     Status: Abnormal   Collection Time: 05/18/20  3:06 PM  Result Value Ref Range   Cholesterol 199 <200 mg/dL    HDL 46 > OR = 40 mg/dL   Triglycerides 240 (H) <150 mg/dL    Comment: . If a non-fasting specimen was collected, consider repeat triglyceride testing on a fasting specimen if clinically indicated.  Yates Decamp et al. J. of Clin. Lipidol. 7048;8:891-694. Marland Kitchen    LDL Cholesterol (Calc) 117 (H) mg/dL (calc)    Comment: Reference range: <100 . Desirable range <100 mg/dL for primary prevention;   <70 mg/dL for patients with CHD or diabetic patients  with > or = 2 CHD risk factors. Marland Kitchen LDL-C is now calculated using the Martin-Hopkins  calculation, which is a validated novel method providing  better accuracy than the Friedewald equation in the  estimation of LDL-C.  Cresenciano Genre et al. Annamaria Helling. 5038;882(80): 2061-2068  (http://education.Quest[employer].com/faq/FAQ164)    Total CHOL/HDL Ratio 4.3 <5.0 (calc)   Non-HDL Cholesterol (Calc) 153 (H) <130 mg/dL (calc)    Comment: For patients with diabetes plus 1 major ASCVD risk  factor, treating to a non-HDL-C goal of <100 mg/dL  (LDL-C of <70 mg/dL) is considered a therapeutic  option.   COMPLETE METABOLIC PANEL WITH GFR     Status: Abnormal   Collection Time: 05/18/20  3:06 PM  Result Value Ref Range   Glucose, Bld 104 (H) 65 - 99 mg/dL    Comment: .            Fasting reference interval . For someone without known diabetes, a glucose value between 100 and 125 mg/dL is consistent with prediabetes and should be confirmed with a follow-up test. .    BUN 19 7 - 25 mg/dL   Creat 0.77 0.70 - 1.33 mg/dL    Comment: For patients >59 years of age, the reference limit for Creatinine is approximately 13% higher for people identified as African-American. .    GFR, Est Non African American 104 > OR = 60 mL/min/1.72m2   GFR, Est African American 120 > OR = 60 mL/min/1.106m2   BUN/Creatinine Ratio NOT APPLICABLE 6 - 22 (calc)   Sodium 139 135 - 146 mmol/L   Potassium 4.3 3.5 - 5.3 mmol/L   Chloride 103 98 - 110 mmol/L   CO2 28 20 - 32 mmol/L    Calcium 9.2 8.6 - 10.3 mg/dL   Total Protein 6.5 6.1 - 8.1 g/dL   Albumin 4.2 3.6 - 5.1 g/dL   Globulin 2.3 1.9 - 3.7 g/dL (calc)   AG Ratio 1.8 1.0 - 2.5 (calc)   Total Bilirubin 0.4 0.2 - 1.2 mg/dL   Alkaline phosphatase (APISO) 51 35 - 144 U/L   AST 16 10 - 35 U/L   ALT 21 9 - 46 U/L  CBC with Differential/Platelet     Status: None   Collection Time: 05/18/20  3:06 PM  Result Value Ref Range   WBC 7.3 3.8 - 10.8 Thousand/uL   RBC 4.60 4.20 - 5.80 Million/uL   Hemoglobin 14.5 13.2 - 17.1 g/dL   HCT 42.0 38.5 - 50.0 %   MCV 91.3 80.0 - 100.0 fL   MCH 31.5 27.0 - 33.0 pg   MCHC 34.5 32.0 -  36.0 g/dL   RDW 12.0 11.0 - 15.0 %   Platelets 368 140 - 400 Thousand/uL   MPV 9.8 7.5 - 12.5 fL   Neutro Abs 4,687 1,500 - 7,800 cells/uL   Lymphs Abs 1,664 850 - 3,900 cells/uL   Absolute Monocytes 745 200 - 950 cells/uL   Eosinophils Absolute 146 15 - 500 cells/uL   Basophils Absolute 58 0 - 200 cells/uL   Neutrophils Relative % 64.2 %   Total Lymphocyte 22.8 %   Monocytes Relative 10.2 %   Eosinophils Relative 2.0 %   Basophils Relative 0.8 %    Radiology: No results found.  No results found.  No results found.    Assessment and Plan: Patient Active Problem List   Diagnosis Date Noted  . Polyp of colon 11/18/2019  . Prediabetes 07/16/2019  . Hyperglycemia 07/15/2019  . Mixed hyperlipidemia 07/15/2019  . Class 1 obesity due to excess calories with serious comorbidity and body mass index (BMI) of 32.0 to 32.9 in adult 07/15/2019  . Elevated BP without diagnosis of hypertension 06/18/2019  . Erectile dysfunction 06/18/2019  . Gastroesophageal reflux disease without esophagitis 06/18/2019    1. obstructive sleep apnea He has history of OSA with a non-functional machine that is over 77 years old. He will need to be reassessed and we will get this taken care of. Will schedule for a sleep study 2. Obesity Obesity Counseling: Had a lengthy discussion regarding patients BMI and  weight issues. Patient was instructed on portion control as well as increased activity. Also discussed caloric restrictions with trying to maintain intake less than 2000 Kcal. Discussions were made in accordance with the 5As of weight management. Simple actions such as not eating late and if able to, taking a walk is suggested. 3. CPAP Counseling: had a lengthy discussion with the patient regarding the importance of PAP therapy in management of the sleep apnea. Patient appears to understand the risk factor reduction and also understands the risks associated with untreated sleep apnea. Patient will try to make a good faith effort to remain compliant with therapy. Also instructed the patient on proper cleaning of the device including the water must be changed daily if possible and use of distilled water is preferred. Patient understands that the machine should be regularly cleaned with appropriate recommended cleaning solutions that do not damage the PAP machine for example given white vinegar and water rinses. Other methods such as ozone treatment may not be as good as these simple methods to achieve cleaning. 4. Wheeze will get PFT unclear history of asthma with a history of smoking  Methods to improve your sleep habits -Keep a consistent wind down period before your bedtime to help you relax. Do not do work-related activities around bedtime.  -Go to bed only when you are sleepy.  -Get out of bed at the same time every morning, even on holidays or weekends.  -If you haven't fallen asleep after lying in bed in 20 minutes, do something boring and relaxing with minimal light expo sure outside the bedroom. Return to bed when you are sleepy.  -While in bed, turn the clock away from you. It'll only remind you of something you probably don't want to know.  -No napping during the day, especially after 3 pm. If a nap is unavoidable, keep it under an hour.  -Your bedroom should be quiet and dark.  -Take a  warm, relaxing bath or shower! A warm bath or shower prior to bedtime increasesyour body's  temperature so the subsequent cooling off period can help induce sleep.  -Exercise regularly, but not tough exercise within 6 hours or bedtime.  -Avoid eating large or fatty meals near bedtime.  -Avoid caffeine after lunch.    1 - Portions taken from http://yoursleep.https://www.mills.com/.aspx  General Counseling: I have discussed the findings of the evaluation and examination with Najee.  I have also discussed any further diagnostic evaluation thatmay be needed or ordered today. Eivan verbalizes understanding of the findings of todays visit. We also reviewed his medications today and discussed drug interactions and side effects including but not limited excessive drowsiness and altered mental states. We also discussed that there is always a risk not just to him but also people around him. he has been encouraged to call the office with any questions or concerns that should arise related to todays visit.  Orders Placed This Encounter  Procedures  . Pulmonary function test    Standing Status:   Future    Standing Expiration Date:   06/07/2021    Order Specific Question:   Where should this test be performed?    Answer:   [facility]  . PSG Sleep Study    Standing Status:   Future    Standing Expiration Date:   06/07/2021    Order Specific Question:   Where should this test be performed:    Answer:   [facility]     Time spent: 29  I have personally obtained a history, examined the patient, evaluated laboratory and imaging results, formulated the assessment and plan and placed orders.    Allyne Gee, MD [facility] Pulmonary and Critical Care Sleep medicine

## 2020-06-25 ENCOUNTER — Encounter (INDEPENDENT_AMBULATORY_CARE_PROVIDER_SITE_OTHER): Payer: 59 | Admitting: Internal Medicine

## 2020-06-25 DIAGNOSIS — G4733 Obstructive sleep apnea (adult) (pediatric): Secondary | ICD-10-CM

## 2020-06-29 NOTE — Procedures (Signed)
[place] Report Part I                                                               Phone: [phone] Fax: [phone]  Patient Name: Todd Castaneda, Todd Castaneda Acquisition Number: [id number]  Date of Birth: 1965-07-09 Acquisition Date: 06/25/2020  Referring Physician: Allyne Gee, MD     History: The patient is a 55 year old male who was referred for re-evaluation of obstructive sleep apnea. Medical History: GERD, Hyperlipidemia, OSA.  Medications: Lipitor, Pepcid, Revatio.  Procedure: This routine overnight polysomnogram was performed on the [facility] 5 using the standard diagnostic protocol. This included 6 channels of EEG, 2 channels of EOG, chin EMG, bilateral anterior tibialis EMG, nasal/oral thermistor, PTAF (nasal pressure transducer), chest and abdominal wall movements, EKG, and pulse oximetry.  Description: The total recording time was 419.8 minutes. The total sleep time was 317.5 minutes. There were a total of 87.5 minutes of wakefulness after sleep onset for a reduced??sleep efficiency of 75.6%. The latency to sleep onset was within normal limits?at 14.8 minutes. The R sleep onset latency was within normal limits at 98.0 minutes.?? Sleep parameters, as a percentage of the total sleep time, demonstrated 6.8% of sleep was in N1 sleep, 87.2% N2, 0.0% N3 and 6.0% R sleep. There were a total of 115 arousals for an arousal index of 21.7 arousals per hour of sleep that was elevated.  Respiratory monitoring demonstrated frequent moderate to severe degree of snoring. Only 5 minutes of supine sleep were observed. There were 89 apneas and hypopneas for an Apnea Hypopnea Index of 16.8 apneas and hypopneas per hour of sleep. The REM related apnea hypopnea index was 44.2/hr of REM sleep compared to a NREM AHI of 15.1/hr. The Respiratory Disturbance Index, which includes 29 respiratory effort related arousals (RERAs), was 22.3 respiratory events per hour of sleep.  The average duration  of the respiratory events was 22.6 seconds with a maximum duration of 51.0 seconds. The respiratory events occurred in the lateral position. The respiratory events were associated with peripheral oxygen desaturations on the average to 92%. The lowest oxygen desaturation associated with a respiratory event was 69%. Additionally, the baseline oxygen saturation during wakefulness was 96%, during NREM sleep averaged 96%, and during REM sleep averaged 95%. The total duration of oxygen < 90% was 1.7 minutes.  Cardiac monitoring- did not demonstrate transient cardiac decelerations associated with the apneas. There were no significant cardiac rhythm irregularities.   Periodic limb movement monitoring- did not demonstrate periodic limb movements.     Impression: ???This routine overnight polysomnogram demonstrated significant obstructive sleep apnea with an overall Apnea Hypopnea Index of 16.8 apneas and hypopneas per hour of sleep, which increased to 44.2 in REM sleep. As REM percentage was markedly reduced, the findings likely underestimate the severity of the sleep apnea.  There was a reduced sleep efficiency with an??elevated arousal index,?increased awakenings, a reduced REM percentage and no slow wave sleep These findings would appear to be due to the obstructive sleep apnea.???????  ??Recommendations:    1. A CPAP titration would be recommended due to the severity of the sleep apnea. 2. Additionally, would recommend weight loss in a patient with a BMI of 33.2.     Allyne Gee,  MD, [facility] Diplomate ABMS-Pulmonary, Critical Care and Sleep Medicine  Electronically reviewed and digitally signed          [place] Report Part II  Phone: [phone] Fax: [phone]  Patient last name Detienne Neck Size   17.5 in. Acquisition [id number]  Patient first name Todd Castaneda Weight 212.0 lbs. Started 06/25/2020 at 9:59:43 PM  Birth date 1965/09/21 Height 67.0 in. Stopped  06/26/2020 at 5:10:01 AM  Age 85 BMI 33.2 lb/in2 Duration 419.8  Study Type Adult      Report generated by: Orland Dec, RPSGT Sleep Data: Lights Out: 10:03:55 PM Sleep Onset: 10:18:43 PM  Lights On: 5:03:43 AM Sleep Efficiency: 75.6 %  Total Recording Time: 419.8 min Sleep Latency (from Lights Off) 14.8 min  Total Sleep Time (TST): 317.5 min R Latency (from Sleep Onset): 98.0 min  Sleep Period Time: 404.5 min Total number of awakenings: 31  Wake during sleep: 87.0 min Wake After Sleep Onset (WASO): 87.5 min   Sleep Data:         Arousal Summary: Stage  Latency from lights out (min) Latency from sleep onset (min) Duration (min) % Total Sleep Time  Normal values  N 1 14.8 0.0 21.5 6.8 (5%)  N 2 15.3 0.5 277.0 87.2 (50%)  N 3       0.0 0.0 (20%)  R 112.8 98.0 19.0 6.0 (25%)    Number Index  Spontaneous 95 18.0  Apneas & Hypopneas 48 9.1  RERAs 29 5.5       (Apneas & Hypopneas & RERAs)  (77) (14.6)  Limb Movement 0 0.0  Snore 0 0.0  TOTAL 172 32.5      Respiratory Data:  CA OA MA Apnea Hypopnea* A+ H RERA Total  Number 4 30 0 34 55 89 29 118  Mean Dur (sec) 13.5 18.0 0.0 17.5 26.2 22.9 21.8 22.6  Max Dur (sec) 16.0 38.5 0.0 38.5 51.0 51.0 33.5 51.0  Total Dur (min) 0.9 9.0 0.0 9.9 24.1 34.0 10.5 44.5  % of TST 0.3 2.8 0.0 3.1 7.6 10.7 3.3 14.0  Index (#/h TST) 0.8 5.7 0.0 6.4 10.4 16.8 5.5 22.3  *Hypopneas scored based on 4% or greater desaturation.  Sleep Stage:        REM NREM TST  AHI 44.2 15.1 16.8  RDI 44.2 20.9 22.3            Body Position Data:  Sleep (min) TST (%) REM (min) NREM (min) CA (#) OA (#) MA (#) HYP (#) AHI (#/h) RERA (#) RDI (#/h) Desat (#)  Supine 5.0 1.57 0.0 5.0 0 0 0 0 0.0 0 0.00 0  Non-Supine 312.50 98.43 19.00 293.50 4.00 30.00 0.00 55.00 17.09 29.00 22.66 75.00  Left: 114.5 36.06 19.0 95.5 1 14  0 37 27.2 8 31.4 49  Right: 198.0 62.36 0.0 198.0 3 16 0 18 11.2 21 17.6 26     Snoring: Total number of snoring  episodes  0  Total time with snoring    min (   % of sleep)   Oximetry Distribution:             WK REM NREM TOTAL  Average (%)   96 95 96 96  < 90% 0.0 1.6 0.1 1.7  < 80% 0.0 0.5 0.0 0.5  < 70% 0.0 0.0 0.0 0.0  # of Desaturations* 2 16 57 75  Desat Index (#/hour) 1.2 50.5 11.5 14.2  Desat Max (%)  5 29 12 29   Desat Max Dur (sec) 38.0 60.0 59.0 60.0  Approx Min O2 during sleep 69  Approx min O2 during a respiratory event 69  Was Oxygen added (Y/N) and final rate No:   0 LPM  *Desaturations based on 4% or greater drop from baseline.   Cheyne Stokes Breathing: None Present    Heart Rate Summary:  Average Heart Rate During Sleep 65.7 bpm      Highest Heart Rate During Sleep (95th %) 74.0 bpm      Highest Heart Rate During Sleep 100 bpm      Highest Heart Rate During Recording (TIB) 187 bpm (artifact)   Heart Rate Observations: Event Type # Events   Bradycardia 0 Lowest HR Scored: N/A  Sinus Tachycardia During Sleep 0 Highest HR Scored: N/A  Narrow Complex Tachycardia 0 Highest HR Scored: N/A  Wide Complex Tachycardia 0 Highest HR Scored: N/A  Asystole 0 Longest Pause: N/A  Atrial Fibrillation 0 Duration Longest Event: N/A  Other Arrythmias  No Type:    Periodic Limb Movement Data: (Primary legs unless otherwise noted) Total # Limb Movement 0 Limb Movement Index 0.0  Total # PLMS    PLMS Index     Total # PLMS Arousals    PLMS Arousal Index     Percentage Sleep Time with PLMS   min (   % sleep)  Mean Duration limb movements (secs)

## 2020-07-07 ENCOUNTER — Ambulatory Visit: Payer: 59 | Admitting: Internal Medicine

## 2020-07-07 ENCOUNTER — Encounter (INDEPENDENT_AMBULATORY_CARE_PROVIDER_SITE_OTHER): Payer: 59 | Admitting: Internal Medicine

## 2020-07-07 DIAGNOSIS — G4733 Obstructive sleep apnea (adult) (pediatric): Secondary | ICD-10-CM

## 2020-07-14 DIAGNOSIS — G4733 Obstructive sleep apnea (adult) (pediatric): Secondary | ICD-10-CM | POA: Insufficient documentation

## 2020-07-14 NOTE — Procedures (Signed)
[facility] Report Part I  Phone: [phone] Fax: [phone]  Patient Name: Todd Castaneda, Todd Castaneda Acquisition Number: [id number]  Date of Birth: 1965/05/27 Acquisition Date: 07/07/2020  Referring Physician: Allyne Gee. MD     History: The patient is a 55 year old male with obstructive sleep apnea for CPAP titration. Medical History: GERD, Hyperlipidemia, OSA,   Medications: Lipitor, Pepcid, Revatio, Vitamin  Procedure: This routine overnight polysomnogram was performed on the [place] 4 or 5 using the standard CPAP/BIPAP protocol. This included 6 channels of EEG, 2 channels of EOG, chin EMG, bilateral anterior tibialis EMG, nasal/oral thermister, PTAF (nasal pressure transducer), chest and abdominal wall movements, EKG, and pulse oximetry.  Description: The total recording time was 467.4 minutes. The total sleep time was 366.5 minutes. There were a total of 98.0 minutes of wakefulness after sleep onset for a ?reduced????sleep efficiency of 78.4%. The latency to sleep onset was short??? at 2.9 minutes. The R sleep onset latency was ???short at 45.5 minutes.??? Sleep parameters, as a percentage of the total sleep time, demonstrated 0.1% of sleep was in N1 sleep, 68.8% N2, 0.1% N3 and 31.0% R sleep. There were a total of 13 arousals for an arousal index of 2.1 arousals per hour of sleep that was normal.???  Overall, there were a total of 12 respiratory events for a respiratory disturbance index, which includes apneas, hypopneas and RERAs (increased respiratory effort) of 2.0 respiratory events per hour of sleep during the pressure titration. CPAP was initiated at  5 cm of H2O at lights out, 10:16:24 PM. It was titrated in 1-2 cm increments to the final pressure of  7 cm of H2O.   Additionally, the baseline oxygen saturation during wakefulness was 96%, during NREM sleep averaged 95%, and during REM sleep averaged 96%. The total duration of oxygen < 90% was 0.3  minutes.  Cardiac monitoring- did not demonstrate transient cardiac decelerations associated with the apneas. There were no significant cardiac rhythm irregularities.   Periodic limb movement monitoring- did not demonstrate periodic limb movements.   Impression: This patient's obstructive sleep apnea demonstrated significant improvement with the utilization of nasal CPAP.      Recommendations: Would recommend utilization of nasal CPAP at 7 cm of H2O.     A Fisher Paykel full face mask, size Medium , was used. Chin strap was not used during study- . Humidifier used during study- .     Allyne Gee, MD, [facility] Diplomate ABMS-Pulmonary, Critical Care and Sleep Medicine  Electronically reviewed and digitally signed     [facility] CPAP/BIPAP Polysomnogram Report Part II Phone: [phone] Fax: [phone]  Patient last name [name] Neck Size 17.5 in. Acquisition [id number]  Patient first name Todd Castaneda Weight 212.0 lbs. Started 07/07/2020 at 9:56:18 PM  Birth date 1966-01-12 Height 67.0 in. Stopped 07/08/2020 at 6:08:06 AM  Age 80      Type Adult BMI 33.2 lb/in2 Duration 467.4  Report generated by: Todd Castaneda Sleep Data: Lights Out: 10:16:24 PM Sleep Onset: 10:19:18 PM  Lights On: 6:03:48 AM Sleep Efficiency: 78.4 %  Total Recording Time: 467.4 min Sleep Latency (from Lights Off) 2.9 min  Total Sleep Time (TST): 366.5 min R Latency (from Sleep Onset): 45.5 min  Sleep Period Time: 375.0 min Total number of awakenings: 6  Wake during sleep: 8.5 min Wake After Sleep Onset (WASO): 98.0 min   Sleep Data:         Arousal Summary: Stage  Latency from lights out (min) Latency from sleep onset (min) Duration (min) % Total Sleep Time  Normal values  N 1 241.9 239.0 0.5 0.1 (5%)  N 2 2.9 0.0 252.0 68.8 (50%)  N 3 133.9 131.0 0.5 0.1 (20%)  R 48.4 45.5 113.5 31.0 (25%)    Number Index  Spontaneous 20 3.3  Apneas & Hypopneas 1 0.2  RERAs 0 0.0       (Apneas & Hypopneas &  RERAs)  (1) (0.2)  Limb Movement 0 0.0  Snore 0 0.0  TOTAL 21 3.4      Respiratory Data:  CA OA MA Apnea Hypopnea* A+ H RERA Total  Number 0 0 0 0 12 12 0 12  Mean Dur (sec) 0.0 0.0 0.0 0.0 39.8 39.8 0.0 39.8  Max Dur (sec) 0.0 0.0 0.0 0.0 79.5 79.5 0.0 79.5  Total Dur (min) 0.0 0.0 0.0 0.0 8.0 8.0 0.0 8.0  % of TST 0.0 0.0 0.0 0.0 2.2 2.2 0.0 2.2  Index (#/h TST) 0.0 0.0 0.0 0.0 2.0 2.0 0.0 2.0  *Hypopneas scored based on 4% or greater desaturation.  Sleep Stage:         Body Position Data:    REM NREM TST  AHI 0.5 2.6 2.0  RDI 0.5 2.6 2.0    Sleep (min) TST (%) REM (min) NREM (min) CA (#) OA (#) MA (#) HYP (#) AHI (#/h) RERA (#) RDI (#/h) Desat (#)  Supine 150.0 40.93 39.5 110.5 0 0 0 0 0.0 0 0.00 0  Non-Supine 216.50 59.07 74.00 142.50 0.00 0.00 0.00 12.00 3.33 0 3.33 12.00  Left: 104.8 28.59 59.5 45.3 0 0 0 3 1.7 0 1.7 3  Right: 111.7 30.48 14.5 97.2 0 0 0 9 4.8 0 4.8 9       Snoring: Total number of snoring episodes  0  Total time with snoring    min (   % of sleep)   Oximetry Distribution:             WK REM NREM TOTAL  Average (%)   96 96 95 96  < 90% 0.0 0.0 0.3 0.3  < 80% 0.0 0.0 0.0 0.0  < 70% 0.0 0.0 0.0 0.0  # of Desaturations* 0 1 11 12   Desat Index (#/hour) 0.0 0.5 2.6 2.0  Desat Max (%) 0 3 6 6   Desat Max Dur (sec) 0.0 13.5 68.0 68.0  Approx Min O2 during sleep 83  Approx min O2 during a respiratory event 88  Was Oxygen added (Y/N) and final rate No:   0 LPM  *Desaturations based on 3% or greater drop from baseline.   Cheyne Stokes Breathing: None Present    Heart Rate Summary:  Average Heart Rate During Sleep 66.8 bpm      Highest Heart Rate During Sleep (95th %) 77.0 bpm      Highest Heart Rate During Sleep 149 bpm      Highest Heart Rate During Recording (TIB) 180 bpm (artifact)   Heart Rate Observations: Event Type # Events   Bradycardia 0 Lowest HR Scored: N/A  Sinus Tachycardia During Sleep 0 Highest HR Scored: N/A   Narrow Complex Tachycardia 0 Highest HR Scored: N/A  Wide Complex Tachycardia 0 Highest HR Scored: N/A  Asystole 0 Longest Pause: N/A  Atrial Fibrillation 0 Duration Longest Event: N/A  Other Arrythmias  No Type:   Periodic Limb Movement Data: (Primary legs unless otherwise noted) Total # Limb Movement 0  Limb Movement Index 0.0  Total # PLMS    PLMS Index     Total # PLMS Arousals    PLMS Arousal Index     Percentage Sleep Time with PLMS   min (   % sleep)  Mean Duration limb movements (secs)      Brief Summary:     Starting pressure: 5  cm of H2O Maximum Pressure:  7 cm of H2O  Mask Type: Fisher [employer] Paykel Full Face Mask Size: Medium  Humidifier used: yes Chin Strap used: no  CFlex: no BiFlex: no    IPAP Level (cmH2O) EPAP Level (cmH2O) Total Duration (min) Sleep Duration (min) Sleep (%) REM (%) CA  #) OA # MA # HYP #) AHI (#/hr) RERAs # RERAs (#/hr) RDI (#/hr)  5 5 42.7 40.2 94.1 0.0 0 0 0 9 13.4 0 0.0 13.4  7 7  332.0 326.0 98.2 34.2 0 0 0 3 0.6 0 0.0 0.6

## 2020-07-20 ENCOUNTER — Ambulatory Visit: Payer: 59 | Admitting: Internal Medicine

## 2020-07-21 ENCOUNTER — Ambulatory Visit: Payer: 59 | Admitting: Internal Medicine

## 2020-08-04 ENCOUNTER — Other Ambulatory Visit: Payer: Self-pay

## 2020-08-04 ENCOUNTER — Ambulatory Visit: Payer: 59 | Admitting: Internal Medicine

## 2020-08-04 DIAGNOSIS — R0602 Shortness of breath: Secondary | ICD-10-CM | POA: Diagnosis not present

## 2020-08-04 LAB — PULMONARY FUNCTION TEST

## 2020-08-05 ENCOUNTER — Ambulatory Visit: Payer: 59 | Admitting: Internal Medicine

## 2020-08-08 NOTE — Procedures (Signed)
[facility] [address] [place], [address]    Complete Pulmonary Function Testing Interpretation:  FINDINGS:  Forced vital capacity is mildly decreased.  FEV1 is 2.46 L which is 69% of predicted and is mildly decreased.  F1 FVC ratio was mildly decreased.  Total lung capacity is mildly decreased.  Residual volume is decreased.  Residual in total lung capacity ratio is decreased.  FRC is decreased.  DLCO was within normal limits.  Postbronchodilator there was no significant change in FEV1  IMPRESSION:  This pulmonary function study is suggestive of mild obstructive lung disease and mild restrictive lung disease.  There is no reduction in the [place], MD [facility] Pulmonary Critical Care Medicine Sleep Medicine

## 2020-08-09 ENCOUNTER — Ambulatory Visit: Payer: 59

## 2020-08-09 NOTE — Progress Notes (Unsigned)
Pt rescheduled his appointment due to not having his copay.

## 2020-08-16 ENCOUNTER — Ambulatory Visit: Payer: 59 | Admitting: Internal Medicine

## 2020-08-16 NOTE — Progress Notes (Signed)
Pt canceled his appointment.  

## 2020-08-17 ENCOUNTER — Ambulatory Visit: Payer: 59 | Admitting: Internal Medicine

## 2020-10-11 ENCOUNTER — Other Ambulatory Visit: Payer: Self-pay

## 2020-10-11 ENCOUNTER — Ambulatory Visit (INDEPENDENT_AMBULATORY_CARE_PROVIDER_SITE_OTHER): Payer: 59 | Admitting: Internal Medicine

## 2020-10-11 VITALS — BP 159/91 | HR 80 | Resp 18 | Ht 68.0 in | Wt 213.8 lb

## 2020-10-11 DIAGNOSIS — G4733 Obstructive sleep apnea (adult) (pediatric): Secondary | ICD-10-CM

## 2020-10-11 DIAGNOSIS — Z7189 Other specified counseling: Secondary | ICD-10-CM

## 2020-10-11 DIAGNOSIS — E6609 Other obesity due to excess calories: Secondary | ICD-10-CM | POA: Diagnosis not present

## 2020-10-11 DIAGNOSIS — Z9989 Dependence on other enabling machines and devices: Secondary | ICD-10-CM | POA: Diagnosis not present

## 2020-10-11 DIAGNOSIS — Z6832 Body mass index (BMI) 32.0-32.9, adult: Secondary | ICD-10-CM

## 2020-10-11 NOTE — Progress Notes (Signed)
[facility], [place] [address]  Pulmonary Sleep Medicine   Office Visit Note  Patient Name: Todd Castaneda DOB: 1966/01/14 MRN [id number]    Chief Complaint: Obstructive Sleep Apnea visit  Brief History:  Todd Castaneda is seen today for initial consult to review last  sleep studies and to get orders for replacement machine The patient states a 20+ year history of sleep apnea. Patient currently not using PAP nightly because his machine is broken.  The patient reports feeling great when his CPAP worked from PAP use. Epworth Sleepiness Score is 7 out of 24. The patient does take naps. The patient complains of the following: can't sleep without CPAP.  Patient states his normal compliance  was average use time of 7 hours and he used it every night. His machine has been broken for some months and he has not used it as a result.  ROS  General: (-) fever, (-) chills, (-) night sweat Nose and Sinuses: (-) nasal stuffiness or itchiness, (-) postnasal drip, (-) nosebleeds, (-) sinus trouble. Mouth and Throat: (-) sore throat, (-) hoarseness. Neck: (-) swollen glands, (-) enlarged thyroid, (-) neck pain. Respiratory: - cough, - shortness of breath, - wheezing. Neurologic: - numbness, - tingling. Psychiatric: - anxiety, - depression   Current Medication: Outpatient Encounter Medications as of 10/11/2020  Medication Sig   atorvastatin (LIPITOR) 10 MG tablet Take 1 tablet (10 mg total) by mouth daily.   famotidine (PEPCID) 20 MG tablet Take 1 tablet (20 mg total) by mouth 2 (two) times daily.   sildenafil (REVATIO) 20 MG tablet Take 1-3 tabs 0.5-2 hours prior to planned sexual activity   No facility-administered encounter medications on file as of 10/11/2020.    Surgical History: Past Surgical History:  Procedure Laterality Date   COLONOSCOPY WITH PROPOFOL N/A 09/12/2019   Procedure: COLONOSCOPY WITH PROPOFOL;  Surgeon: Jonathon Bellows, MD;  Location: [facility] ENDOSCOPY;   Service: Gastroenterology;  Laterality: N/A;    Medical History: Past Medical History:  Diagnosis Date   GERD (gastroesophageal reflux disease)    Hyperlipemia     Family History: Non contributory to the present illness  Social History: Social History   Socioeconomic History   Marital status: Legally Separated    Spouse name: Not on file   Number of children: 2   Years of education: Not on file   Highest education level: GED or equivalent  Occupational History   Not on file  Tobacco Use   Smoking status: Former   Smokeless tobacco: Never  Scientific laboratory technician Use: Never used  Substance and Sexual Activity   Alcohol use: Never   Drug use: Never   Sexual activity: Yes  Other Topics Concern   Not on file  Social History Narrative   Not on file   Social Determinants of Health   Financial Resource Strain: Not on file  Food Insecurity: Not on file  Transportation Needs: Not on file  Physical Activity: Not on file  Stress: Not on file  Social Connections: Not on file  Intimate Partner Violence: Not on file    Vital Signs: Blood pressure (!) 159/91, pulse 80, resp. rate 18, height '5\' 8"'$  (1.727 m), weight 213 lb 12.8 oz (97 kg), SpO2 97 %.  Examination: General Appearance: The patient is well-developed, well-nourished, and in no distress. Neck Circumference: 46 cm Skin: Gross inspection of skin unremarkable. Head: normocephalic, no gross deformities. Eyes: no gross deformities noted. ENT: ears appear grossly normal  Neurologic: Alert and oriented. No involuntary movements.    EPWORTH SLEEPINESS SCALE:  Scale:  (0)= no chance of dozing; (1)= slight chance of dozing; (2)= moderate chance of dozing; (3)= high chance of dozing  Chance  Situtation    Sitting and reading: 2    Watching TV: 2    Sitting Inactive in public: 0    As a passenger in car: 1      Lying down to rest: 1    Sitting and talking: 0    Sitting quielty after lunch: 1    In a car,  stopped in traffic: 0   TOTAL SCORE:   7 out of 24    SLEEP STUDIES:  PSG 06/25/20 - overall AHI 16.8/Hr, REM AHI 44.2, Low SpO2 69%   CPAP COMPLIANCE DATA:  Date Range: CURRENT MACHINE IS BROKEN , NEW TO  THIS PRACTICE  Average Daily Use: 7 hours  Median Use: -  Compliance for > 4 Hours: - days  AHI: - respiratory events per hour  Days Used: -  Mask Leak: -  95th Percentile Pressure: -         LABS: Recent Results (from the past 2160 hour(s))  Pulmonary function test     Status: None   Collection Time: 08/04/20 10:00 AM  Result Value Ref Range   FEV1     FVC     FEV1/FVC     TLC     DLCO      Radiology: No results found.  No results found.  No results found.    Assessment and Plan: Patient Active Problem List   Diagnosis Date Noted   OSA (obstructive sleep apnea) 07/14/2020   Polyp of colon 11/18/2019   Prediabetes 07/16/2019   Hyperglycemia 07/15/2019   Mixed hyperlipidemia 07/15/2019   Class 1 obesity due to excess calories with serious comorbidity and body mass index (BMI) of 32.0 to 32.9 in adult 07/15/2019   Elevated BP without diagnosis of hypertension 06/18/2019   Erectile dysfunction 06/18/2019   Gastroesophageal reflux disease without esophagitis 06/18/2019  1. OSA on CPAP The patient has moderate apnea based on his overall AHI of 16.8, which worsens with REM to 44.2 He needs to resume CPAP at 7 cm. He did previously  tolerate PAP and reports  benefit from PAP use. The patient was reminded how to clean equipment and advised to replace supplies routinely. The patient was also counselled on weight loss.  OSA- resume cpap at 7 cm after set up. F/u 30 days after set up.    2. CPAP use counseling CPAP Counseling: had a lengthy discussion with the patient regarding the importance of PAP therapy in management of the sleep apnea. Patient appears to understand the risk factor reduction and also understands the risks associated with untreated  sleep apnea. Patient will try to make a good faith effort to remain compliant with therapy. Also instructed the patient on proper cleaning of the device including the water must be changed daily if possible and use of distilled water is preferred. Patient understands that the machine should be regularly cleaned with appropriate recommended cleaning solutions that do not damage the PAP machine for example given white vinegar and water rinses. Other methods such as ozone treatment may not be as good as these simple methods to achieve cleaning.   3. Class 1 obesity due to excess calories with serious comorbidity and body mass index (BMI) of 32.0 to 32.9 in adult Obesity Counseling: Had a lengthy  discussion regarding patients BMI and weight issues. Patient was instructed on portion control as well as increased activity. Also discussed caloric restrictions with trying to maintain intake less than 2000 Kcal. Discussions were made in accordance with the 5As of weight management. Simple actions such as not eating late and if able to, taking a walk is suggested.     General Counseling: I have discussed the findings of the evaluation and examination with Todd Castaneda.  I have also discussed any further diagnostic evaluation thatmay be needed or ordered today. Todd Castaneda verbalizes understanding of the findings of todays visit. We also reviewed his medications today and discussed drug interactions and side effects including but not limited excessive drowsiness and altered mental states. We also discussed that there is always a risk not just to him but also people around him. he has been encouraged to call the office with any questions or concerns that should arise related to todays visit.  No orders of the defined types were placed in this encounter.       I have personally obtained a history, examined the patient, evaluated laboratory and imaging results, formulated the assessment and plan and placed orders.   This patient  was seen today by Tressie Ellis, PA-C in collaboration with Dr. Devona Konig.    Allyne Gee, MD [facility] Diplomate ABMS Pulmonary and Critical Care Medicine Sleep medicine

## 2020-10-11 NOTE — Patient Instructions (Signed)

## 2020-10-12 ENCOUNTER — Ambulatory Visit: Payer: 59 | Admitting: Internal Medicine

## 2020-10-26 ENCOUNTER — Ambulatory Visit: Payer: 59 | Admitting: Internal Medicine

## 2020-11-18 ENCOUNTER — Other Ambulatory Visit: Payer: Self-pay

## 2020-11-18 ENCOUNTER — Encounter: Payer: Self-pay | Admitting: Family Medicine

## 2020-11-18 ENCOUNTER — Ambulatory Visit: Payer: 59 | Admitting: Family Medicine

## 2020-11-18 VITALS — BP 124/78 | HR 75 | Temp 98.2°F | Resp 16 | Ht 68.0 in | Wt 219.8 lb

## 2020-11-18 DIAGNOSIS — Z6833 Body mass index (BMI) 33.0-33.9, adult: Secondary | ICD-10-CM

## 2020-11-18 DIAGNOSIS — Z5181 Encounter for therapeutic drug level monitoring: Secondary | ICD-10-CM

## 2020-11-18 DIAGNOSIS — G4733 Obstructive sleep apnea (adult) (pediatric): Secondary | ICD-10-CM

## 2020-11-18 DIAGNOSIS — K219 Gastro-esophageal reflux disease without esophagitis: Secondary | ICD-10-CM | POA: Diagnosis not present

## 2020-11-18 DIAGNOSIS — E782 Mixed hyperlipidemia: Secondary | ICD-10-CM

## 2020-11-18 DIAGNOSIS — E669 Obesity, unspecified: Secondary | ICD-10-CM

## 2020-11-18 DIAGNOSIS — N529 Male erectile dysfunction, unspecified: Secondary | ICD-10-CM | POA: Diagnosis not present

## 2020-11-18 DIAGNOSIS — R030 Elevated blood-pressure reading, without diagnosis of hypertension: Secondary | ICD-10-CM

## 2020-11-18 DIAGNOSIS — R7303 Prediabetes: Secondary | ICD-10-CM

## 2020-11-18 MED ORDER — SILDENAFIL CITRATE 20 MG PO TABS: ORAL_TABLET | ORAL | 5 refills | Status: DC

## 2020-11-18 MED ORDER — PANTOPRAZOLE SODIUM 40 MG PO TBEC
40.0000 mg | DELAYED_RELEASE_TABLET | Freq: Every day | ORAL | 3 refills | Status: DC
Start: 2020-11-18 — End: 2021-06-08

## 2020-11-18 NOTE — Progress Notes (Signed)
Name: Todd Castaneda   MRN: [id number]    DOB: [birth year]   Date:11/18/2020       Progress Note  Chief Complaint  Patient presents with   Hyperlipidemia   Gastroesophageal Reflux   Erectile Dysfunction     Subjective:   Todd Castaneda is a 55 y.o. male, presents to clinic for routine f/up  GERD - using OTC med still has frequent flares depending on food triggers  Hyperlipidemia: Currently treated with atorvastatin 10 mg, pt reports good med compliance Last Lipids:   LDL had reduced from >160's to 117, no change to med or diet/lifestyle/weight Lab Results  Component Value Date   CHOL 199 05/18/2020   HDL 46 05/18/2020   LDLCALC 117 (H) 05/18/2020   TRIG 240 (H) 05/18/2020   CHOLHDL 4.3 05/18/2020   - Denies: Chest pain, shortness of breath, myalgias, claudication  BP at goal today not on meds Obese Wt Readings from Last 5 Encounters:  11/18/20 219 lb 12.8 oz (99.7 kg)  10/11/20 213 lb 12.8 oz (97 kg)  06/07/20 220 lb 6.4 oz (100 kg)  05/18/20 219 lb (99.3 kg)  11/19/19 205 lb 4.8 oz (93.1 kg)   BMI Readings from Last 5 Encounters:  11/18/20 33.42 kg/m  10/11/20 32.51 kg/m  06/07/20 33.51 kg/m  05/18/20 34.30 kg/m  11/19/19 32.15 kg/m   Weight 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   ED - sildenafil working well    Current Outpatient Medications:    atorvastatin (LIPITOR) 10 MG tablet, Take 1 tablet (10 mg total) by mouth daily., Disp: 90 tablet, Rfl: 3   famotidine (PEPCID) 20 MG tablet, Take 1 tablet (20 mg total) by mouth 2 (two) times daily., Disp: 14 tablet, Rfl: 0   sildenafil (REVATIO) 20 MG tablet, Take 1-3 tabs 0.5-2 hours prior to planned sexual activity, Disp: 30 tablet, Rfl: 5  Patient Active Problem List   Diagnosis Date Noted   OSA on CPAP 10/11/2020   CPAP use counseling 10/11/2020   OSA (obstructive sleep apnea) 07/14/2020   Polyp of colon 11/18/2019   Prediabetes 07/16/2019   Hyperglycemia 07/15/2019    Mixed hyperlipidemia 07/15/2019   Class 1 obesity due to excess calories with serious comorbidity and body mass index (BMI) of 32.0 to 32.9 in adult 07/15/2019   Elevated BP without diagnosis of hypertension 06/18/2019   Erectile dysfunction 06/18/2019   Gastroesophageal reflux disease without esophagitis 06/18/2019    Past Surgical History:  Procedure Laterality Date   COLONOSCOPY WITH PROPOFOL N/A 09/12/2019   Procedure: COLONOSCOPY WITH PROPOFOL;  Surgeon: Jonathon Bellows, MD;  Location: [facility] ENDOSCOPY;  Service: Gastroenterology;  Laterality: N/A;    Family History  Problem Relation Age of Onset   Diabetes Mother     Social History   Tobacco Use   Smoking status: Former   Smokeless tobacco: Never  Scientific laboratory technician Use: Never used  Substance Use Topics   Alcohol use: Never   Drug use: Never     Allergies  Allergen Reactions   Augmentin [Amoxicillin-[place] Maintenance  Topic Date Due   COVID-19 Vaccine (1) 12/04/2020 (Originally 08/11/1966)   Zoster Vaccines- Shingrix (1 of 2) 02/17/2021 (Originally 02/09/2017)   COLONOSCOPY (Pts 45-74yrs Insurance coverage will need to be confirmed)  09/11/2024   TETANUS/TDAP  06/17/2029   Hepatitis C Screening  Completed   HIV Screening  Completed   HPV VACCINES  Aged Out  INFLUENZA VACCINE  Discontinued    Chart Review Today: I personally reviewed active problem list, medication list, allergies, family history, social history, health maintenance, notes from last encounter, lab results, imaging with the patient/caregiver today.   Review of Systems  Constitutional: Negative.   HENT: Negative.    Eyes: Negative.   Respiratory: Negative.    Cardiovascular: Negative.   Gastrointestinal: Negative.   Endocrine: Negative.   Genitourinary: Negative.   Musculoskeletal: Negative.   Skin: Negative.   Allergic/Immunologic: Negative.   Neurological: Negative.   Hematological: Negative.    Psychiatric/Behavioral: Negative.    All other systems reviewed and are negative.   Objective:   Vitals:   11/18/20 0825  BP: 124/78  Pulse: 75  Resp: 16  Temp: 98.2 F (36.8 C)  SpO2: 98%  Weight: 219 lb 12.8 oz (99.7 kg)  Height: 5\' 8"  (1.727 m)    Body mass index is 33.42 kg/m.  Physical Exam Vitals and nursing note reviewed.  Constitutional:      General: He is not in acute distress.    Appearance: Normal appearance. He is well-developed. He is obese. He is not ill-appearing, toxic-appearing or diaphoretic.     Interventions: Face mask in place.  HENT:     Head: Normocephalic and atraumatic.     Jaw: No trismus.     Right Ear: External ear normal.     Left Ear: External ear normal.  Eyes:     General: Lids are normal. No scleral icterus.       Right eye: No discharge.        Left eye: No discharge.     Conjunctiva/sclera: Conjunctivae normal.  Neck:     Trachea: Trachea and phonation normal. No tracheal deviation.  Cardiovascular:     Rate and Rhythm: Normal rate and regular rhythm.     Pulses: Normal pulses.          Radial pulses are 2+ on the right side and 2+ on the left side.       Posterior tibial pulses are 2+ on the right side and 2+ on the left side.     Heart sounds: Normal heart sounds. No murmur heard.   No friction rub. No gallop.  Pulmonary:     Effort: Pulmonary effort is normal. No respiratory distress.     Breath sounds: Normal breath sounds. No stridor. No wheezing, rhonchi or rales.  Abdominal:     General: Bowel sounds are normal. There is no distension.     Palpations: Abdomen is soft.  Musculoskeletal:     Right lower leg: No edema.     Left lower leg: No edema.  Skin:    General: Skin is warm and dry.     Coloration: Skin is not jaundiced.     Findings: No rash.     Nails: There is no clubbing.  Neurological:     Mental Status: He is alert. Mental status is at baseline.     Cranial Nerves: No dysarthria or facial asymmetry.      Motor: No tremor or abnormal muscle tone.     Gait: Gait normal.  Psychiatric:        Mood and Affect: Mood normal.        Speech: Speech normal.        Behavior: Behavior normal. Behavior is cooperative.        Assessment & Plan:      ICD-10-CM   1. Mixed hyperlipidemia  E78.2 Lipid  panel    COMPLETE METABOLIC PANEL WITH GFR   good LDL reduction on lisinopril 10 mg, no other changes since last labs, no labs needed today, continue med, encouraged diet/lifestyle efforts as well   Lab Results  Component Value Date   CHOL 199 05/18/2020   CHOL 231 (H) 06/18/2019   Lab Results  Component Value Date   HDL 46 05/18/2020   HDL 49 06/18/2019   Lab Results  Component Value Date   LDLCALC 117 (H) 05/18/2020   LDLCALC 163 (H) 06/18/2019   Lab Results  Component Value Date   TRIG 240 (H) 05/18/2020   TRIG 88 06/18/2019   Lab Results  Component Value Date   CHOLHDL 4.3 05/18/2020   CHOLHDL 4.7 06/18/2019   No results found for: LDLDIRECT  Would like to see further reduction of LDL - will check at next appt Discussed diet/lifestyle efforts, possibly increasing dose if needed Will f/up 6 months     2. Gastroesophageal reflux disease without esophagitis  K21.9    better controlled with OTC meds - doesn't know name, triggered nearly daily by certain foods, protonix x 2 weeks as needed with worsening flares/sx    3. Erectile dysfunction, unspecified erectile dysfunction type  N52.9 sildenafil (REVATIO) 20 MG tablet   doing well on meds, working w/o SE or concerns    4. Medication monitoring encounter  Z51.81 Lipid panel    COMPLETE METABOLIC PANEL WITH GFR    5. Prediabetes  R73.03    Lab Results  Component Value Date   HGBA1C 5.6 11/19/2019   Last labs normal, will continue to monitor - check next with CPE     6. OSA (obstructive sleep apnea)  G47.33    did work up with Dr. Humphrey Rolls, unable to afford CPAP machine- insurance will not pay for it - offered to help  resend in CPAP orders if Dr. Humphrey Rolls unable to do or if the pt finds cheaper cash price - over $1000 for machine, cannot afford    7. Elevated BP without diagnosis of hypertension  R03.0    without meds, BP at goal today    8. Class 1 obesity with serious comorbidity and body mass index (BMI) of 33.0 to 33.9 in adult, unspecified obesity type  E66.9    Z68.33   Weight up a little, with HLD, ED, OSA     Return in about 6 months (around 05/18/2021) for Annual Physical, Routine follow-up fasting labs with appt.   Delsa Grana, PA-C 11/18/20 8:36 AM

## 2021-05-17 NOTE — Patient Instructions (Incomplete)

## 2021-05-19 ENCOUNTER — Encounter: Payer: 59 | Admitting: Family Medicine

## 2021-05-19 DIAGNOSIS — Z23 Encounter for immunization: Secondary | ICD-10-CM

## 2021-05-19 DIAGNOSIS — R7303 Prediabetes: Secondary | ICD-10-CM

## 2021-05-19 DIAGNOSIS — Z125 Encounter for screening for malignant neoplasm of prostate: Secondary | ICD-10-CM

## 2021-05-19 DIAGNOSIS — Z5181 Encounter for therapeutic drug level monitoring: Secondary | ICD-10-CM

## 2021-05-19 DIAGNOSIS — Z0000 Encounter for general adult medical examination without abnormal findings: Secondary | ICD-10-CM

## 2021-05-19 DIAGNOSIS — E782 Mixed hyperlipidemia: Secondary | ICD-10-CM

## 2021-05-19 DIAGNOSIS — K219 Gastro-esophageal reflux disease without esophagitis: Secondary | ICD-10-CM

## 2021-05-31 ENCOUNTER — Encounter: Payer: 59 | Admitting: Family Medicine

## 2021-06-08 ENCOUNTER — Encounter: Payer: Self-pay | Admitting: Family Medicine

## 2021-06-08 ENCOUNTER — Ambulatory Visit: Payer: BC Managed Care – PPO | Admitting: Family Medicine

## 2021-06-08 ENCOUNTER — Other Ambulatory Visit: Payer: Self-pay

## 2021-06-08 VITALS — BP 124/76 | HR 76 | Temp 98.3°F | Resp 16 | Ht 68.0 in | Wt 225.4 lb

## 2021-06-08 DIAGNOSIS — E782 Mixed hyperlipidemia: Secondary | ICD-10-CM

## 2021-06-08 DIAGNOSIS — Z0000 Encounter for general adult medical examination without abnormal findings: Secondary | ICD-10-CM | POA: Diagnosis not present

## 2021-06-08 DIAGNOSIS — N529 Male erectile dysfunction, unspecified: Secondary | ICD-10-CM

## 2021-06-08 DIAGNOSIS — Z9989 Dependence on other enabling machines and devices: Secondary | ICD-10-CM

## 2021-06-08 DIAGNOSIS — R7303 Prediabetes: Secondary | ICD-10-CM

## 2021-06-08 DIAGNOSIS — K219 Gastro-esophageal reflux disease without esophagitis: Secondary | ICD-10-CM | POA: Diagnosis not present

## 2021-06-08 DIAGNOSIS — E6609 Other obesity due to excess calories: Secondary | ICD-10-CM

## 2021-06-08 DIAGNOSIS — G4733 Obstructive sleep apnea (adult) (pediatric): Secondary | ICD-10-CM

## 2021-06-08 DIAGNOSIS — R030 Elevated blood-pressure reading, without diagnosis of hypertension: Secondary | ICD-10-CM

## 2021-06-08 DIAGNOSIS — Z6832 Body mass index (BMI) 32.0-32.9, adult: Secondary | ICD-10-CM

## 2021-06-08 MED ORDER — SILDENAFIL CITRATE 20 MG PO TABS: ORAL_TABLET | ORAL | 5 refills | Status: AC

## 2021-06-08 MED ORDER — ATORVASTATIN CALCIUM 10 MG PO TABS: 10.0000 mg | ORAL_TABLET | Freq: Every day | ORAL | 3 refills | Status: DC

## 2021-06-08 NOTE — Assessment & Plan Note (Signed)
Doing well on current regimen, no changes made today. 

## 2021-06-08 NOTE — Assessment & Plan Note (Signed)
Compliant with CPAP, continue.  

## 2021-06-08 NOTE — Assessment & Plan Note (Signed)
Compliant with statin, continue. Obtaining labs today. ?

## 2021-06-08 NOTE — Assessment & Plan Note (Signed)
-  Counseled on diet and exercise.

## 2021-06-08 NOTE — Assessment & Plan Note (Signed)
BP normal today. Continue to monitor.  ?

## 2021-06-08 NOTE — Progress Notes (Signed)
? ?BP 124/76   Pulse 76   Temp 98.3 ?F (36.8 ?C) (Oral)   Resp 16   Ht '5\' 8"'$  (1.727 m)   Wt 225 lb 6.4 oz (102.2 kg)   SpO2 98%   BMI 34.27 kg/m?   ? ?Subjective:  ? ? Patient ID: CHIVAS NOTZ, male    DOB: Aug 04, 1965, 56 y.o.   MRN: [id number] ? ?HPI: ?Todd Castaneda is a 56 y.o. male presenting on 06/08/2021 for comprehensive medical examination. Current medical complaints include:none ? ?HLD ?- medications: atorvastatin ?- compliance: good ?- medication SEs: none ? ?Prediabetes ?- Last A1c 5.6 ?- Medications: none ?- Compliance: n/a ?- Checking BG at home: no ?- Diet: eats 3 meals per day with occasional snacks.  ?- Exercise: active with work, [employer].  ?- Denies symptoms of hypoglycemia, polyuria, polydipsia, numbness extremities ? ?GERD - on omeprazole OTC with good relief, feels it works better than  pantoprazole. Previously on H2 blockers without much relief. ? ?ED - on sildenafil prn with good effect ? ?OSA - doing well with CPAP.  ? ?Interim Problems from his last visit: no ? ?Depression Screen done today and results listed below:  ? ?  06/08/2021  ?  8:22 AM 11/18/2020  ?  8:24 AM 05/18/2020  ?  3:03 PM 11/19/2019  ?  7:40 AM 07/16/2019  ?  7:49 AM  ?Depression screen PHQ 2/9  ?Decreased Interest 0 0 0 0 0  ?Down, Depressed, Hopeless 0 0 0 0 0  ?PHQ - 2 Score 0 0 0 0 0  ?Altered sleeping 0 0   0  ?Tired, decreased energy 0 0   0  ?Change in appetite 0 0   0  ?Feeling bad or failure about yourself  0 0   0  ?Trouble concentrating 0 0   0  ?Moving slowly or fidgety/restless 0 0   0  ?Suicidal thoughts 0 0   0  ?PHQ-9 Score 0 0   0  ?Difficult doing work/chores Not difficult at all Not difficult at all   Not difficult at all  ? ? ?The patient does not have a history of falls. ? ? ?Past Medical History:  ?Past Medical History:  ?Diagnosis Date  ? GERD (gastroesophageal reflux disease)   ? Hyperlipemia   ? ? ?Surgical History:  ?Past Surgical History:  ?Procedure Laterality Date  ? COLONOSCOPY  WITH PROPOFOL N/A 09/12/2019  ? Procedure: COLONOSCOPY WITH PROPOFOL;  Surgeon: Jonathon Bellows, MD;  Location[facility] ENDOSCOPY;  Service: Gastroenterology;  Laterality: N/A;  ? ? ?Medications:  ?Current Outpatient Medications on File Prior to Visit  ?Medication Sig  ? omeprazole (PRILOSEC) 20 MG capsule Take 20 mg by mouth daily.  ? ?No current facility-administered medications on file prior to visit.  ? ? ?Allergies:  ?Allergies  ?Allergen Reactions  ? Augmentin [Amoxicillin-Pot Clavulanate] Hives  ? ? ?Social History:  ?Social History  ? ?Socioeconomic History  ? Marital status: Legally Separated  ?  Spouse name: Not on file  ? Number of children: 2  ? Years of education: Not on file  ? Highest education level: GED or equivalent  ?Occupational History  ? Not on file  ?Tobacco Use  ? Smoking status: Former  ? Smokeless tobacco: Never  ?Vaping Use  ? Vaping Use: Never used  ?Substance and Sexual Activity  ? Alcohol use: Never  ? Drug use: Never  ? Sexual activity: Yes  ?Other Topics Concern  ? Not on  file  ?Social History Narrative  ? Not on file  ? ?Social Determinants of Health  ? ?Financial Resource Strain: Low Risk   ? Difficulty of Paying Living Expenses: Not hard at all  ?Food Insecurity: No Food Insecurity  ? Worried About Charity fundraiser in the Last Year: Never true  ? Ran Out of Food in the Last Year: Never true  ?Transportation Needs: No Transportation Needs  ? Lack of Transportation (Medical): No  ? Lack of Transportation (Non-Medical): No  ?Physical Activity: Inactive  ? Days of Exercise per Week: 0 days  ? Minutes of Exercise per Session: 0 min  ?Stress: No Stress Concern Present  ? Feeling of Stress : Not at all  ?Social Connections: Unknown  ? Frequency of Communication with Friends and Family: More than three times a week  ? Frequency of Social Gatherings with Friends and Family: More than three times a week  ? Attends Religious Services: Never  ? Active Member of Clubs or Organizations: No  ? Attends  Archivist Meetings: Never  ? Marital Status: Not on file  ?Intimate Partner Violence: Not At Risk  ? Fear of Current or Ex-Partner: No  ? Emotionally Abused: No  ? Physically Abused: No  ? Sexually Abused: No  ? ?Social History  ? ?Tobacco Use  ?Smoking Status Former  ?Smokeless Tobacco Never  ? ?Social History  ? ?Substance and Sexual Activity  ?Alcohol Use Never  ? ? ?Family History:  ?Family History  ?Problem Relation Age of Onset  ? Diabetes Mother   ? ? ?Past medical history, surgical history, medications, allergies, family history and social history reviewed with patient today and changes made to appropriate areas of the chart.  ? ?   ?Objective:  ?  ?BP 124/76   Pulse 76   Temp 98.3 ?F (36.8 ?C) (Oral)   Resp 16   Ht '5\' 8"'$  (1.727 m)   Wt 225 lb 6.4 oz (102.2 kg)   SpO2 98%   BMI 34.27 kg/m?   ?Wt Readings from Last 3 Encounters:  ?06/08/21 225 lb 6.4 oz (102.2 kg)  ?11/18/20 219 lb 12.8 oz (99.7 kg)  ?10/11/20 213 lb 12.8 oz (97 kg)  ?  ?Physical Exam ?Vitals reviewed.  ?Constitutional:   ?   General: He is not in acute distress. ?   Appearance: Normal appearance. He is not toxic-appearing.  ?HENT:  ?   Head: Normocephalic.  ?   Right Ear: Tympanic membrane, ear canal and external ear normal.  ?   Left Ear: Tympanic membrane, ear canal and external ear normal.  ?   Nose: Nose normal.  ?   Mouth/Throat:  ?   Mouth: Mucous membranes are moist.  ?   Pharynx: Oropharynx is clear.  ?Eyes:  ?   Extraocular Movements: Extraocular movements intact.  ?Cardiovascular:  ?   Rate and Rhythm: Normal rate and regular rhythm.  ?   Heart sounds: Normal heart sounds. No murmur heard. ?Pulmonary:  ?   Effort: Pulmonary effort is normal.  ?   Breath sounds: Normal breath sounds. No wheezing.  ?Abdominal:  ?   General: Bowel sounds are normal.  ?   Palpations: Abdomen is soft.  ?   Tenderness: There is no abdominal tenderness.  ?Musculoskeletal:     ?   General: Normal range of motion.  ?   Cervical back:  Normal range of motion.  ?   Right lower leg: No edema.  ?  Left lower leg: No edema.  ?Lymphadenopathy:  ?   Cervical: No cervical adenopathy.  ?Skin: ?   General: Skin is warm and dry.  ?Neurological:  ?   Mental Status: He is alert and oriented to person, place, and time. Mental status is at baseline.  ?   Gait: Gait normal.  ?Psychiatric:     ?   Mood and Affect: Mood normal.     ?   Behavior: Behavior normal.  ? ? ?Results for orders placed or performed in visit on 08/04/20  ?Pulmonary function test  ?Result Value Ref Range  ? FEV1    ? FVC    ? FEV1/FVC    ? TLC    ? DLCO    ? ?   ?Assessment & Plan:  ? ?Problem List Items Addressed This Visit   ? ?  ? Respiratory  ? OSA on CPAP  ?  Compliant with CPAP, continue. ? ?  ?  ?  ? Digestive  ? Gastroesophageal reflux disease without esophagitis  ?  Doing well on current regimen, no changes made today. ? ?  ?  ? Relevant Medications  ? omeprazole (PRILOSEC) 20 MG capsule  ?  ? Other  ? Class 1 obesity due to excess calories with serious comorbidity and body mass index (BMI) of 32.0 to 32.9 in adult  ?  Counseled on diet and exercise. ? ?  ?  ? Relevant Orders  ? Lipid panel  ? Hemoglobin A1c  ? Elevated BP without diagnosis of hypertension  ?  BP normal today. Continue to monitor.  ? ?  ?  ? Erectile dysfunction  ?  Doing well on current regimen, no changes made today. ? ?  ?  ? Relevant Medications  ? sildenafil (REVATIO) 20 MG tablet  ? Mixed hyperlipidemia  ?  Compliant with statin, continue. Obtaining labs today. ? ?  ?  ? Relevant Medications  ? atorvastatin (LIPITOR) 10 MG tablet  ? sildenafil (REVATIO) 20 MG tablet  ? Other Relevant Orders  ? Lipid panel  ? Prediabetes  ?  Recheck a1c today. Counseled on weight loss through diet and exercise.  ? ?  ?  ? Relevant Orders  ? Lipid panel  ? Hemoglobin A1c  ? ?Other Visit Diagnoses   ? ? Annual physical exam    -  Primary  ? Relevant Orders  ? Comprehensive metabolic panel  ? Lipid panel  ? Hemoglobin A1c  ? CBC  with Differential  ? ?  ?  ? ?LABORATORY TESTING:  ?Health maintenance labs ordered today as discussed above.  ? ?The natural history of prostate cancer and ongoing controversy regarding screening and potential treatm

## 2021-06-08 NOTE — Assessment & Plan Note (Signed)
Recheck a1c today. Counseled on weight loss through diet and exercise.  ?

## 2021-06-09 LAB — CBC WITH DIFFERENTIAL/PLATELET
Absolute Monocytes: 522 cells/uL (ref 200–950)
Basophils Absolute: 41 cells/uL (ref 0–200)
Basophils Relative: 0.7 %
Eosinophils Absolute: 93 cells/uL (ref 15–500)
Eosinophils Relative: 1.6 %
HCT: 43 % (ref 38.5–50.0)
Hemoglobin: 14.3 g/dL (ref 13.2–17.1)
Lymphs Abs: 1380 cells/uL (ref 850–3900)
MCH: 31 pg (ref 27.0–33.0)
MCHC: 33.3 g/dL (ref 32.0–36.0)
MCV: 93.1 fL (ref 80.0–100.0)
MPV: 9.6 fL (ref 7.5–12.5)
Monocytes Relative: 9 %
Neutro Abs: 3764 cells/uL (ref 1500–7800)
Neutrophils Relative %: 64.9 %
Platelets: 339 10*3/uL (ref 140–400)
RBC: 4.62 10*6/uL (ref 4.20–5.80)
RDW: 12.5 % (ref 11.0–15.0)
Total Lymphocyte: 23.8 %
WBC: 5.8 10*3/uL (ref 3.8–10.8)

## 2021-06-09 LAB — HEMOGLOBIN A1C
Hgb A1c MFr Bld: 5.8 % of total Hgb — ABNORMAL HIGH (ref <5.7)
Mean Plasma Glucose: 120 mg/dL
eAG (mmol/L): 6.6 mmol/L

## 2021-06-09 LAB — COMPREHENSIVE METABOLIC PANEL
AG Ratio: 1.8 (calc) (ref 1.0–2.5)
ALT: 31 U/L (ref 9–46)
AST: 21 U/L (ref 10–35)
Albumin: 4.3 g/dL (ref 3.6–5.1)
Alkaline phosphatase (APISO): 54 U/L (ref 35–144)
BUN: 24 mg/dL (ref 7–25)
CO2: 27 mmol/L (ref 20–32)
Calcium: 9.4 mg/dL (ref 8.6–10.3)
Chloride: 104 mmol/L (ref 98–110)
Creat: 0.84 mg/dL (ref 0.70–1.30)
Globulin: 2.4 g/dL (calc) (ref 1.9–3.7)
Glucose, Bld: 103 mg/dL — ABNORMAL HIGH (ref 65–99)
Potassium: 4.3 mmol/L (ref 3.5–5.3)
Sodium: 139 mmol/L (ref 135–146)
Total Bilirubin: 0.4 mg/dL (ref 0.2–1.2)
Total Protein: 6.7 g/dL (ref 6.1–8.1)

## 2021-06-09 LAB — LIPID PANEL
Cholesterol: 161 mg/dL (ref <200)
HDL: 51 mg/dL (ref >=40)
LDL Cholesterol (Calc): 89 mg/dL (calc)
Non-HDL Cholesterol (Calc): 110 mg/dL (calc) (ref <130)
Total CHOL/HDL Ratio: 3.2 (calc) (ref <5.0)
Triglycerides: 117 mg/dL (ref <150)

## 2021-12-05 ENCOUNTER — Encounter: Payer: Self-pay | Admitting: Family Medicine

## 2022-06-13 ENCOUNTER — Encounter: Payer: BC Managed Care – PPO | Admitting: Family Medicine

## 2022-06-15 NOTE — Patient Instructions (Signed)

## 2022-06-16 ENCOUNTER — Encounter: Payer: Self-pay | Admitting: Family Medicine

## 2022-06-16 ENCOUNTER — Ambulatory Visit (INDEPENDENT_AMBULATORY_CARE_PROVIDER_SITE_OTHER): Payer: 59 | Admitting: Family Medicine

## 2022-06-16 VITALS — BP 134/82 | HR 70 | Temp 97.7°F | Resp 16 | Ht 68.0 in | Wt 222.5 lb

## 2022-06-16 DIAGNOSIS — Z0000 Encounter for general adult medical examination without abnormal findings: Secondary | ICD-10-CM

## 2022-06-16 DIAGNOSIS — Z125 Encounter for screening for malignant neoplasm of prostate: Secondary | ICD-10-CM

## 2022-06-16 LAB — CBC WITH DIFFERENTIAL/PLATELET
Absolute Monocytes: 582 cells/uL (ref 200–950)
Basophils Absolute: 30 cells/uL (ref 0–200)
Eosinophils Absolute: 102 cells/uL (ref 15–500)
Eosinophils Relative: 1.7 %
MCHC: 34.9 g/dL (ref 32.0–36.0)
MCV: 90.4 fL (ref 80.0–100.0)
MPV: 9.4 fL (ref 7.5–12.5)
Monocytes Relative: 9.7 %
RBC: 4.69 10*6/uL (ref 4.20–5.80)
WBC: 6 10*3/uL (ref 3.8–10.8)

## 2022-06-16 NOTE — Progress Notes (Signed)
Patient: Todd Castaneda, Male    DOB: 1966/01/20, 57 y.o.   MRN: [id number] Danelle Berry, PA-C Visit Date: 06/16/2022  Today's Provider: Danelle Berry, PA-C   Chief Complaint  Patient presents with   Annual Exam   Subjective:   Annual physical exam:  Todd Castaneda is a 57 y.o. male who presents today for health maintenance and annual & complete physical exam.   Exercise/Activity:  walking Diet/nutrition:     no particular efforts  Sleep:   OSA reports good CPAP compliance  SDOH Screenings   Food Insecurity: No Food Insecurity (06/16/2022)  Housing: Low Risk  (06/16/2022)  Transportation Needs: No Transportation Needs (06/16/2022)  Utilities: Not At Risk (06/16/2022)  Alcohol Screen: Low Risk  (06/16/2022)  Depression (PHQ2-9): Low Risk  (06/16/2022)  Financial Resource Strain: High Risk (06/16/2022)  Physical Activity: Inactive (06/16/2022)  Social Connections: Socially Isolated (06/16/2022)  Stress: Stress Concern Present (06/16/2022)  Tobacco Use: Medium Risk (06/16/2022)     USPSTF grade A and B recommendations - reviewed and addressed today  Depression:  Phq 9 completed today by patient, was reviewed by me with patient in the room, score is  negative, pt feels overall good    06/16/2022    8:52 AM 06/08/2021    8:22 AM 11/18/2020    8:24 AM  Depression screen PHQ 2/9  Decreased Interest 0 0 0  Down, Depressed, Hopeless 0 0 0  PHQ - 2 Score 0 0 0  Altered sleeping 0 0 0  Tired, decreased energy 0 0 0  Change in appetite 0 0 0  Feeling bad or failure about yourself  0 0 0  Trouble concentrating 0 0 0  Moving slowly or fidgety/restless 0 0 0  Suicidal thoughts 0 0 0  PHQ-9 Score 0 0 0  Difficult doing work/chores Not difficult at all Not difficult at all Not difficult at all    Hep C Screening:  done  STD testing and prevention (HIV/chl/gon/syphilis): HIV,   Intimate partner violence:  safe, lives with 2 roomates  Advanced Care Planning:  A voluntary discussion  about advance care planning including the explanation and discussion of advance directives.  Discussed health care proxy and Living will, and the patient was able to identify a health care proxy as one of his sons.  Patient does not have a living will at present time. If patient does have living will, I have requested they bring this to the clinic to be scanned in to their chart.  Health Maintenance  Topic Date Due   Zoster Vaccines- Shingrix (1 of 2) 09/15/2022 (Originally 02/09/2017)   INFLUENZA VACCINE  09/21/2022   COLONOSCOPY (Pts 45-22yrs Insurance coverage will need to be confirmed)  09/11/2024   DTaP/Tdap/Td (2 - Td or Tdap) 06/17/2029   Hepatitis C Screening  Completed   HIV Screening  Completed   HPV VACCINES  Aged Out   COVID-19 Vaccine  Discontinued    Skin cancer:   Pt reports no hx of skin cancer, suspicious lesions/biopsies in the past.  Colorectal cancer:  colonoscopy is UTD Pt denies change in bowels, blood in stool   Prostate cancer: \ Prostate cancer screening with PSA: Discussed risks and benefits of PSA testing and provided handout. Pt declines to have PSA drawn today. Lab Results  Component Value Date   PSA 1.0 06/18/2019    Urinary Symptoms:   IPSS     Row Name 06/16/22 [phone]  International Prostate Symptom Score   How often have you had the sensation of not emptying your bladder? Not at All     How often have you had to urinate less than every two hours? Not at All     How often have you found you stopped and started again several times when you urinated? Not at All     How often have you found it difficult to postpone urination? Not at All     How often have you had a weak urinary stream? Not at All     How often have you had to strain to start urination? Not at All     How many times did you typically get up at night to urinate? None     Total IPSS Score 0       Quality of Life due to urinary symptoms   If you were to spend the rest of your  life with your urinary condition just the way it is now how would you feel about that? Pleased              Lung cancer:   Low Dose CT Chest recommended if Age 75-80 years, 20 pack-year currently smoking OR have quit w/in 15years. Patient does not qualify.   Social History   Tobacco Use   Smoking status: Former    Types: Cigars   Smokeless tobacco: Never   Tobacco comments:    I didn't smoke that much. I quit instantly  Substance Use Topics   Alcohol use: Not Currently    Alcohol/week: 3.0 standard drinks of alcohol    Types: 3 Cans of beer per week    Comment: Don't drink but once every now and then     Alcohol screening: Flowsheet Row Office Visit from 06/16/2022 in [facility]  AUDIT-C Score 4       AAA: not indicated The USPSTF recommends one-time screening with ultrasonography in men ages 17 to 22 years who have ever smoked  ECG:not indicatd today  Blood pressure/Hypertension: BP Readings from Last 3 Encounters:  06/16/22 134/82  06/08/21 124/76  11/18/20 124/78   Weight/Obesity: Wt Readings from Last 3 Encounters:  06/16/22 222 lb 8 oz (100.9 kg)  06/08/21 225 lb 6.4 oz (102.2 kg)  11/18/20 219 lb 12.8 oz (99.7 kg)   BMI Readings from Last 3 Encounters:  06/16/22 33.83 kg/m  06/08/21 34.27 kg/m  11/18/20 33.42 kg/m    Lipids:  Lab Results  Component Value Date   CHOL 161 06/08/2021   CHOL 199 05/18/2020   CHOL 231 (H) 06/18/2019   Lab Results  Component Value Date   HDL 51 06/08/2021   HDL 46 05/18/2020   HDL 49 06/18/2019   Lab Results  Component Value Date   LDLCALC 89 06/08/2021   LDLCALC 117 (H) 05/18/2020   LDLCALC 163 (H) 06/18/2019   Lab Results  Component Value Date   TRIG 117 06/08/2021   TRIG 240 (H) 05/18/2020   TRIG 88 06/18/2019   Lab Results  Component Value Date   CHOLHDL 3.2 06/08/2021   CHOLHDL 4.3 05/18/2020   CHOLHDL 4.7 06/18/2019   No results found for: "LDLDIRECT" Based on the  results of lipid panel his/her cardiovascular risk factor ( using Poole Cohort )  in the next 10 years is : The 10-year ASCVD risk score (Arnett DK, et al., 2019) is: 4.6%   Values used to calculate the score:  Age: 64 years     Sex: Male     Is Non-Hispanic African American: No     Diabetic: No     Tobacco smoker: No     Systolic Blood Pressure: 134 mmHg     Is BP treated: No     HDL Cholesterol: 51 mg/dL     Total Cholesterol: 161 mg/dL Glucose:  Glucose  Date Value Ref Range Status  08/22/2013 124 (H) 65 - 99 mg/dL Final   Glucose, Bld  Date Value Ref Range Status  06/08/2021 103 (H) 65 - 99 mg/dL Final    Comment:    .            Fasting reference interval . For someone without known diabetes, a glucose value between 100 and 125 mg/dL is consistent with prediabetes and should be confirmed with a follow-up test. .   05/18/2020 104 (H) 65 - 99 mg/dL Final    Comment:    .            Fasting reference interval . For someone without known diabetes, a glucose value between 100 and 125 mg/dL is consistent with prediabetes and should be confirmed with a follow-up test. .   06/18/2019 109 (H) 65 - 99 mg/dL Final    Comment:    .            Fasting reference interval . For someone without known diabetes, a glucose value between 100 and 125 mg/dL is consistent with prediabetes and should be confirmed with a follow-up test. .     Social History       Social History   Socioeconomic History   Marital status: Legally Separated    Spouse name: Not on file   Number of children: 2   Years of education: Not on file   Highest education level: GED or equivalent  Occupational History   Not on file  Tobacco Use   Smoking status: Former    Types: Cigars   Smokeless tobacco: Never   Tobacco comments:    I didn't smoke that much. I quit instantly  Vaping Use   Vaping Use: Never used  Substance and Sexual Activity   Alcohol use: Not Currently    Alcohol/week:  3.0 standard drinks of alcohol    Types: 3 Cans of beer per week    Comment: Don't drink but once every now and then   Drug use: Never   Sexual activity: Yes    Birth control/protection: Condom, None  Other Topics Concern   Not on file  Social History Narrative   Not on file   Social Determinants of Health   Financial Resource Strain: High Risk (06/16/2022)   Overall Financial Resource Strain (CARDIA)    Difficulty of Paying Living Expenses: Hard  Food Insecurity: No Food Insecurity (06/16/2022)   Hunger Vital Sign    Worried About Running Out of Food in the Last Year: Never true    Ran Out of Food in the Last Year: Never true  Transportation Needs: No Transportation Needs (06/16/2022)   PRAPARE - Administrator, Civil Service (Medical): No    Lack of Transportation (Non-Medical): No  Physical Activity: Inactive (06/16/2022)   Exercise Vital Sign    Days of Exercise per Week: 0 days    Minutes of Exercise per Session: 0 min  Stress: Stress Concern Present (06/16/2022)   [employer] of Occupational Health - Occupational Stress Questionnaire    Feeling of  Stress : To some extent  Social Connections: Socially Isolated (06/16/2022)   Social Connection and Isolation Panel [NHANES]    Frequency of Communication with Friends and Family: More than three times a week    Frequency of Social Gatherings with Friends and Family: More than three times a week    Attends Religious Services: Never    Database administrator or Organizations: No    Attends Engineer, structural: Never    Marital Status: Separated    Family History        Family History  Problem Relation Age of Onset   Diabetes Mother     Patient Active Problem List   Diagnosis Date Noted   OSA on CPAP 10/11/2020   Polyp of colon 11/18/2019   Prediabetes 07/16/2019   Hyperglycemia 07/15/2019   Mixed hyperlipidemia 07/15/2019   Class 1 obesity due to excess calories with serious comorbidity and  body mass index (BMI) of 32.0 to 32.9 in adult 07/15/2019   Elevated BP without diagnosis of hypertension 06/18/2019   Erectile dysfunction 06/18/2019   Gastroesophageal reflux disease without esophagitis 06/18/2019    Past Surgical History:  Procedure Laterality Date   COLONOSCOPY WITH PROPOFOL N/A 09/12/2019   Procedure: COLONOSCOPY WITH PROPOFOL;  Surgeon: Wyline Mood, MD;  Location: [facility] ENDOSCOPY;  Service: Gastroenterology;  Laterality: N/A;     Current Outpatient Medications:    atorvastatin (LIPITOR) 10 MG tablet, Take 1 tablet (10 mg total) by mouth daily., Disp: 90 tablet, Rfl: 3   sildenafil (REVATIO) 20 MG tablet, Take 1-3 tabs 0.5-2 hours prior to planned sexual activity, Disp: 30 tablet, Rfl: 5   omeprazole (PRILOSEC) 20 MG capsule, Take 20 mg by mouth daily., Disp: , Rfl:   Allergies  Allergen Reactions   Augmentin [Amoxicillin-Pot Clavulanate] Hives    Patient Care Team: Danelle Berry, PA-C as PCP - General (Family Medicine) Patient, No Pcp Per (General Practice)   Chart Review: I personally reviewed active problem list, medication list, allergies, family history, social history, health maintenance, notes from last encounter, lab results, imaging with the patient/caregiver today.   Review of Systems  Constitutional: Negative.   HENT: Negative.    Eyes: Negative.   Respiratory: Negative.    Cardiovascular: Negative.   Gastrointestinal: Negative.   Endocrine: Negative.   Genitourinary: Negative.   Musculoskeletal: Negative.   Skin: Negative.   Allergic/Immunologic: Negative.   Neurological: Negative.   Hematological: Negative.   Psychiatric/Behavioral: Negative.    All other systems reviewed and are negative.         Objective:   Vitals:  Vitals:   06/16/22 0857  BP: 134/82  Pulse: 70  Resp: 16  Temp: 97.7 F (36.5 C)  TempSrc: Oral  SpO2: 98%  Weight: 222 lb 8 oz (100.9 kg)  Height: 5\' 8"  (1.727 m)    Body mass index is 33.83  kg/m.  Physical Exam Vitals and nursing note reviewed.  Constitutional:      General: He is not in acute distress.    Appearance: Normal appearance. He is well-developed and well-groomed. He is obese. He is not ill-appearing, toxic-appearing or diaphoretic.  HENT:     Head: Normocephalic and atraumatic.     Jaw: No trismus.     Right Ear: External ear normal.     Left Ear: External ear normal.     Nose: Congestion present. No rhinorrhea.     Mouth/Throat:     Mouth: Mucous membranes are moist.  Pharynx: Oropharynx is clear. No oropharyngeal exudate or posterior oropharyngeal erythema.  Eyes:     General: Lids are normal. No scleral icterus.       Right eye: No discharge.        Left eye: No discharge.     Conjunctiva/sclera: Conjunctivae normal.  Neck:     Trachea: Trachea and phonation normal. No tracheal deviation.  Cardiovascular:     Rate and Rhythm: Normal rate and regular rhythm.     Pulses: Normal pulses.          Radial pulses are 2+ on the right side and 2+ on the left side.       Posterior tibial pulses are 2+ on the right side and 2+ on the left side.     Heart sounds: Normal heart sounds. No murmur heard.    No friction rub. No gallop.  Pulmonary:     Effort: Pulmonary effort is normal. No respiratory distress.     Breath sounds: Normal breath sounds. No stridor. No wheezing, rhonchi or rales.  Abdominal:     General: Bowel sounds are normal. There is no distension.     Palpations: Abdomen is soft.     Tenderness: There is no abdominal tenderness. There is no guarding.  Musculoskeletal:     Cervical back: Normal range of motion.     Right lower leg: Edema present.     Left lower leg: Edema present.  Lymphadenopathy:     Cervical: No cervical adenopathy.  Skin:    General: Skin is warm and dry.     Coloration: Skin is not jaundiced.     Findings: No rash.     Nails: There is no clubbing.  Neurological:     Mental Status: He is alert. Mental status is at  baseline.     Cranial Nerves: No dysarthria or facial asymmetry.     Motor: No tremor or abnormal muscle tone.     Coordination: Coordination normal.     Gait: Gait normal.  Psychiatric:        Mood and Affect: Mood normal.        Speech: Speech normal.        Behavior: Behavior normal. Behavior is cooperative.      No results found for this or any previous visit (from the past 2160 hour(s)).  Fall Risk:    06/16/2022    8:52 AM 06/08/2021    8:21 AM 11/18/2020    8:24 AM 05/18/2020    3:03 PM 11/19/2019    7:40 AM  Fall Risk   Falls in the past year? 0 0 0 0 0  Number falls in past yr: 0 0 0 0 0  Injury with Fall? 0 0 0 0 0  Risk for fall due to : No Fall Risks      Follow up Falls prevention discussed;Education provided;Falls evaluation completed Falls evaluation completed   Falls evaluation completed    Functional Status Survey: Is the patient deaf or have difficulty hearing?: No Does the patient have difficulty seeing, even when wearing glasses/contacts?: No Does the patient have difficulty concentrating, remembering, or making decisions?: No Does the patient have difficulty walking or climbing stairs?: No Does the patient have difficulty dressing or bathing?: No Does the patient have difficulty doing errands alone such as visiting a doctor's office or shopping?: No   Assessment & Plan:    CPE completed today  Prostate cancer screening and PSA options (with potential risks and  benefits of testing vs not testing) were discussed along with recent recs/guidelines, shared decision making and handout/information given to pt today  USPSTF grade A and B recommendations reviewed with patient; age-appropriate recommendations, preventive care, screening tests, etc discussed and encouraged; healthy living encouraged; see AVS for patient education given to patient  Discussed importance of 150 minutes of physical activity weekly, AHA exercise recommendations given to pt in  AVS/handout  Discussed importance of healthy diet:  eating lean meats and proteins, avoiding trans fats and saturated fats, avoid simple sugars and excessive carbs in diet, eat 6 servings of fruit/vegetables daily and drink plenty of water and avoid sweet beverages.  DASH diet reviewed if pt has HTN  Recommended pt to do annual eye exam and routine dental exams/cleanings  Advance Care planning information and packet discussed and offered today, encouraged pt to discuss with family members/spouse/partner/friends and complete Advanced directive packet and bring copy to office   Reviewed Health Maintenance: Health Maintenance  Topic Date Due   Zoster Vaccines- Shingrix (1 of 2) 09/15/2022 (Originally 02/09/2017)   INFLUENZA VACCINE  09/21/2022   COLONOSCOPY (Pts 45-96yrs Insurance coverage will need to be confirmed)  09/11/2024   DTaP/Tdap/Td (2 - Td or Tdap) 06/17/2029   Hepatitis C Screening  Completed   HIV Screening  Completed   HPV VACCINES  Aged Out   COVID-19 Vaccine  Discontinued    Immunizations: Immunization History  Administered Date(s) Administered   Tdap 06/18/2019   Vaccines:  HPV: up to at age 81 , ask insurance if age between 53-45  Shingrix: 60-64 yo and ask insurance if covered when patient above 15 yo Pneumonia:  educated and discussed with patient. Flu:  educated and discussed with patient. COVID:       ICD-10-CM   1. Annual physical exam  Z00.00 COMPLETE METABOLIC PANEL WITH GFR    CBC with Differential/Platelet    Lipid panel    Hemoglobin A1c    CANCELED: PSA      Return in about 6 months (around 12/16/2022) for Routine follow-up.     Danelle Berry, PA-C 06/16/22 9:24 AM  [facility]

## 2022-06-17 LAB — COMPLETE METABOLIC PANEL WITH GFR
AG Ratio: 1.9 (calc) (ref 1.0–2.5)
ALT: 34 U/L (ref 9–46)
AST: 21 U/L (ref 10–35)
Albumin: 4.4 g/dL (ref 3.6–5.1)
Alkaline phosphatase (APISO): 56 U/L (ref 35–144)
BUN: 20 mg/dL (ref 7–25)
CO2: 28 mmol/L (ref 20–32)
Calcium: 9.7 mg/dL (ref 8.6–10.3)
Chloride: 103 mmol/L (ref 98–110)
Creat: 0.79 mg/dL (ref 0.70–1.30)
Globulin: 2.3 g/dL (calc) (ref 1.9–3.7)
Glucose, Bld: 102 mg/dL — ABNORMAL HIGH (ref 65–99)
Potassium: 4.7 mmol/L (ref 3.5–5.3)
Sodium: 139 mmol/L (ref 135–146)
Total Bilirubin: 0.5 mg/dL (ref 0.2–1.2)
Total Protein: 6.7 g/dL (ref 6.1–8.1)
eGFR: 105 mL/min/{1.73_m2} (ref >=60)

## 2022-06-17 LAB — LIPID PANEL
Cholesterol: 224 mg/dL — ABNORMAL HIGH (ref <200)
HDL: 51 mg/dL (ref >=40)
LDL Cholesterol (Calc): 136 mg/dL (calc) — ABNORMAL HIGH
Non-HDL Cholesterol (Calc): 173 mg/dL (calc) — ABNORMAL HIGH (ref <130)
Total CHOL/HDL Ratio: 4.4 (calc) (ref <5.0)
Triglycerides: 229 mg/dL — ABNORMAL HIGH (ref <150)

## 2022-06-17 LAB — CBC WITH DIFFERENTIAL/PLATELET
Basophils Relative: 0.5 %
HCT: 42.4 % (ref 38.5–50.0)
Hemoglobin: 14.8 g/dL (ref 13.2–17.1)
Lymphs Abs: 1590 cells/uL (ref 850–3900)
MCH: 31.6 pg (ref 27.0–33.0)
Neutro Abs: 3696 cells/uL (ref 1500–7800)
Neutrophils Relative %: 61.6 %
Platelets: 365 10*3/uL (ref 140–400)
RDW: 12.6 % (ref 11.0–15.0)
Total Lymphocyte: 26.5 %

## 2022-06-17 LAB — HEMOGLOBIN A1C
Hgb A1c MFr Bld: 6 % of total Hgb — ABNORMAL HIGH (ref <5.7)
Mean Plasma Glucose: 126 mg/dL
eAG (mmol/L): 7 mmol/L

## 2022-06-22 ENCOUNTER — Other Ambulatory Visit: Payer: Self-pay | Admitting: Family Medicine

## 2022-06-22 DIAGNOSIS — E782 Mixed hyperlipidemia: Secondary | ICD-10-CM

## 2022-06-22 MED ORDER — ATORVASTATIN CALCIUM 10 MG PO TABS
10.0000 mg | ORAL_TABLET | Freq: Every day | ORAL | 3 refills | Status: AC
Start: 2022-06-22 — End: ?

## 2022-06-22 NOTE — Telephone Encounter (Signed)
Medication Refill - Medication: atorvastatin (LIPITOR) 10 MG tablet   Has the patient contacted their pharmacy? Yes.   Contact provider to send a new RX  Preferred Pharmacy (with phone number or street name):  [facility] [address] [facility] - [place], [place] - [address] AT [facility] [address] Phone: [phone]  Fax: [phone]   Has the patient been seen for an appointment in the last year OR does the patient have an upcoming appointment? Yes.    Agent: Please be advised that RX refills may take up to 3 business days. We ask that you follow-up with your pharmacy.

## 2022-09-21 ENCOUNTER — Other Ambulatory Visit: Payer: Self-pay | Admitting: Family Medicine

## 2022-09-21 DIAGNOSIS — E782 Mixed hyperlipidemia: Secondary | ICD-10-CM

## 2022-12-15 ENCOUNTER — Ambulatory Visit: Payer: 59 | Admitting: Physician Assistant

## 2022-12-27 ENCOUNTER — Ambulatory Visit: Payer: 59 | Admitting: Physician Assistant

## 2023-01-23 ENCOUNTER — Ambulatory Visit: Payer: 59 | Admitting: Physician Assistant

## 2023-02-19 ENCOUNTER — Emergency Department: Payer: Self-pay

## 2023-02-19 ENCOUNTER — Other Ambulatory Visit: Payer: Self-pay

## 2023-02-19 DIAGNOSIS — R300 Dysuria: Secondary | ICD-10-CM | POA: Insufficient documentation

## 2023-02-19 DIAGNOSIS — Z5321 Procedure and treatment not carried out due to patient leaving prior to being seen by health care provider: Secondary | ICD-10-CM | POA: Insufficient documentation

## 2023-02-19 DIAGNOSIS — R109 Unspecified abdominal pain: Secondary | ICD-10-CM | POA: Insufficient documentation

## 2023-02-19 LAB — URINALYSIS, ROUTINE W REFLEX MICROSCOPIC
Bacteria, UA: NONE SEEN
Bilirubin Urine: NEGATIVE
Glucose, UA: >=500 mg/dL — AB
Ketones, ur: NEGATIVE mg/dL
Leukocytes,Ua: NEGATIVE
Nitrite: NEGATIVE
Protein, ur: NEGATIVE mg/dL
RBC / HPF: >50 RBC/hpf (ref 0–5)
Specific Gravity, Urine: 1.022 (ref 1.005–1.030)
pH: 5 (ref 5.0–8.0)

## 2023-02-19 LAB — CBC
HCT: 39.9 % (ref 39.0–52.0)
Hemoglobin: 13.9 g/dL (ref 13.0–17.0)
MCH: 32.1 pg (ref 26.0–34.0)
MCHC: 34.8 g/dL (ref 30.0–36.0)
MCV: 92.1 fL (ref 80.0–100.0)
Platelets: 351 10*3/uL (ref 150–400)
RBC: 4.33 MIL/uL (ref 4.22–5.81)
RDW: 12.4 % (ref 11.5–15.5)
WBC: 8.6 10*3/uL (ref 4.0–10.5)
nRBC: 0 % (ref 0.0–0.2)

## 2023-02-19 LAB — BASIC METABOLIC PANEL
Anion gap: 9 (ref 5–15)
BUN: 19 mg/dL (ref 6–20)
CO2: 21 mmol/L — ABNORMAL LOW (ref 22–32)
Calcium: 8.7 mg/dL — ABNORMAL LOW (ref 8.9–10.3)
Chloride: 104 mmol/L (ref 98–111)
Creatinine, Ser: 0.85 mg/dL (ref 0.61–1.24)
GFR, Estimated: >60 mL/min (ref >60)
Glucose, Bld: 140 mg/dL — ABNORMAL HIGH (ref 70–99)
Potassium: 3.4 mmol/L — ABNORMAL LOW (ref 3.5–5.1)
Sodium: 134 mmol/L — ABNORMAL LOW (ref 135–145)

## 2023-02-19 NOTE — ED Triage Notes (Signed)
Pt to ED via POV c/o l flank pain, dysuria. Pt reports this started today. Flank pain sometimes radiates to left abd, no abd pain at this time. Denies CP, SOB, fevers.

## 2023-02-20 ENCOUNTER — Emergency Department
Admission: EM | Admit: 2023-02-20 | Discharge: 2023-02-20 | Disposition: A | Discharge status: Home / Self Care | Payer: Self-pay | Attending: Emergency Medicine | Admitting: Emergency Medicine
